# Patient Record
Sex: Male | Born: 1950
Health system: Southern US, Community
[De-identification: ages and names within clinical notes are randomized; demographics above are authoritative.]

## PROBLEM LIST (undated history)

## (undated) DIAGNOSIS — R0600 Dyspnea, unspecified: Secondary | ICD-10-CM

## (undated) DIAGNOSIS — M1990 Unspecified osteoarthritis, unspecified site: Secondary | ICD-10-CM

## (undated) DIAGNOSIS — M25512 Pain in left shoulder: Secondary | ICD-10-CM

## (undated) DIAGNOSIS — R42 Dizziness and giddiness: Secondary | ICD-10-CM

## (undated) DIAGNOSIS — I2699 Other pulmonary embolism without acute cor pulmonale: Secondary | ICD-10-CM

## (undated) DIAGNOSIS — R972 Elevated prostate specific antigen [PSA]: Secondary | ICD-10-CM

## (undated) DIAGNOSIS — K649 Unspecified hemorrhoids: Secondary | ICD-10-CM

## (undated) DIAGNOSIS — M549 Dorsalgia, unspecified: Secondary | ICD-10-CM

## (undated) DIAGNOSIS — G4700 Insomnia, unspecified: Secondary | ICD-10-CM

## (undated) DIAGNOSIS — H933X1 Disorders of right acoustic nerve: Secondary | ICD-10-CM

## (undated) DIAGNOSIS — E785 Hyperlipidemia, unspecified: Secondary | ICD-10-CM

## (undated) DIAGNOSIS — M25511 Pain in right shoulder: Secondary | ICD-10-CM

## (undated) DIAGNOSIS — K219 Gastro-esophageal reflux disease without esophagitis: Secondary | ICD-10-CM

## (undated) DIAGNOSIS — J310 Chronic rhinitis: Secondary | ICD-10-CM

## (undated) DIAGNOSIS — I4891 Unspecified atrial fibrillation: Secondary | ICD-10-CM

## (undated) DIAGNOSIS — I499 Cardiac arrhythmia, unspecified: Secondary | ICD-10-CM

## (undated) DIAGNOSIS — R351 Nocturia: Secondary | ICD-10-CM

## (undated) DIAGNOSIS — R3911 Hesitancy of micturition: Secondary | ICD-10-CM

## (undated) DIAGNOSIS — H9190 Unspecified hearing loss, unspecified ear: Secondary | ICD-10-CM

## (undated) DIAGNOSIS — N529 Male erectile dysfunction, unspecified: Secondary | ICD-10-CM

## (undated) HISTORY — PX: COLONOSCOPY: SHX174

## (undated) HISTORY — DX: Pain in right shoulder: M25.511

## (undated) HISTORY — DX: Hesitancy of micturition: R39.11

## (undated) HISTORY — PX: OTHER SURGICAL HISTORY: SHX169

## (undated) HISTORY — DX: Hyperlipidemia, unspecified: E78.5

## (undated) HISTORY — DX: Chronic rhinitis: J31.0

## (undated) HISTORY — DX: Nocturia: R35.1

## (undated) HISTORY — PX: TONSILLECTOMY AND ADENOIDECTOMY: SUR1326

## (undated) HISTORY — DX: Male erectile dysfunction, unspecified: N52.9

## (undated) HISTORY — DX: Insomnia, unspecified: G47.00

## (undated) HISTORY — DX: Unspecified osteoarthritis, unspecified site: M19.90

## (undated) HISTORY — DX: Pain in right shoulder: M25.512

## (undated) HISTORY — DX: Disorders of right acoustic nerve: H93.3X1

## (undated) HISTORY — DX: Unspecified atrial fibrillation: I48.91

## (undated) HISTORY — PX: RETINAL DETACHMENT SURGERY: SHX105

## (undated) HISTORY — DX: Elevated prostate specific antigen (PSA): R97.20

## (undated) HISTORY — DX: Gastro-esophageal reflux disease without esophagitis: K21.9

## (undated) HISTORY — DX: Dorsalgia, unspecified: M54.9

## (undated) HISTORY — DX: Unspecified hemorrhoids: K64.9

## (undated) HISTORY — DX: Unspecified hearing loss, unspecified ear: H91.90

## (undated) HISTORY — PX: CATARACT EXTRACTION: SUR2

## (undated) HISTORY — DX: Other pulmonary embolism without acute cor pulmonale: I26.99

## (undated) HISTORY — DX: Dizziness and giddiness: R42

## (undated) HISTORY — DX: Dyspnea, unspecified: R06.00

---

## 1987-05-23 HISTORY — PX: VASECTOMY: SHX75

## 2014-10-07 ENCOUNTER — Ambulatory Visit: Payer: Self-pay | Admitting: Cardiovascular Disease

## 2014-10-09 ENCOUNTER — Encounter: Payer: Self-pay | Admitting: Internal Medicine

## 2014-10-09 ENCOUNTER — Ambulatory Visit (INDEPENDENT_AMBULATORY_CARE_PROVIDER_SITE_OTHER): Payer: BLUE CROSS/BLUE SHIELD | Admitting: Internal Medicine

## 2014-10-09 VITALS — BP 126/74 | HR 75 | Ht 66.0 in | Wt 136.2 lb

## 2014-10-09 DIAGNOSIS — I480 Paroxysmal atrial fibrillation: Secondary | ICD-10-CM

## 2014-10-09 NOTE — Patient Instructions (Signed)
Medication Instructions:  Your physician recommends that you continue on your current medications as directed. Please refer to the Current Medication list given to you today.  Labwork: None ordered  Testing/Procedures: Your physician has recommended that you wear a 24 holter monitor. Holter monitors are medical devices that record the heart's electrical activity. Doctors most often use these monitors to diagnose arrhythmias. Arrhythmias are problems with the speed or rhythm of the heartbeat. The monitor is a small, portable device. You can wear one while you do your normal daily activities. This is usually used to diagnose what is causing palpitations/syncope (passing out).   Your physician has requested that you have an echocardiogram (before the 4 week follow up with Roderic Palau, NP). Echocardiography is a painless test that uses sound waves to create images of your heart. It provides your doctor with information about the size and shape of your heart and how well your heart's chambers and valves are working. This procedure takes approximately one hour. There are no restrictions for this procedure.   Your physician has recommended that you have a Cardioversion (DCCV) in 5 weeks.  Electrical Cardioversion uses a jolt of electricity to your heart either through paddles or wired patches attached to your chest. This is a controlled, usually prescheduled, procedure. Defibrillation is done under light anesthesia in the hospital, and you usually go home the day of the procedure. This is done to get your heart back into a normal rhythm. You are not awake for the procedure.  Miklos Bidinger, RN will call you to schedule this  Follow-Up: Your physician recommends that you schedule a follow-up appointment in: 4 weeks with Roderic Palau, NP   Thank you for choosing [facility]!!     Echocardiogram An echocardiogram, or echocardiography, uses sound waves (ultrasound) to produce an image of your  heart. The echocardiogram is simple, painless, obtained within a short period of time, and offers valuable information to your health care provider. The images from an echocardiogram can provide information such as:  Evidence of coronary artery disease (CAD).  Heart size.  Heart muscle function.  Heart valve function.  Aneurysm detection.  Evidence of a past heart attack.  Fluid buildup around the heart.  Heart muscle thickening.  Assess heart valve function. LET [facility] CARE PROVIDER KNOW ABOUT:  Any allergies you have.  All medicines you are taking, including vitamins, herbs, eye drops, creams, and over-the-counter medicines.  Previous problems you or members of your family have had with the use of anesthetics.  Any blood disorders you have.  Previous surgeries you have had.  Medical conditions you have.  Possibility of pregnancy, if this applies. BEFORE THE PROCEDURE  No special preparation is needed. Eat and drink normally.  PROCEDURE   In order to produce an image of your heart, gel will be applied to your chest and a wand-like tool (transducer) will be moved over your chest. The gel will help transmit the sound waves from the transducer. The sound waves will harmlessly bounce off your heart to allow the heart images to be captured in real-time motion. These images will then be recorded.  You may need an IV to receive a medicine that improves the quality of the pictures. AFTER THE PROCEDURE You may return to your normal schedule including diet, activities, and medicines, unless your health care provider tells you otherwise. Document Released: 05/05/2000 Document Revised: 09/22/2013 Document Reviewed: 01/13/2013 [facility] Patient Information 2015 [place], [place]. This information is not intended to replace advice  given to you by your health care provider. Make sure you discuss any questions you have with your health care provider.    Electrical  Cardioversion Electrical cardioversion is the delivery of a jolt of electricity to change the rhythm of the heart. Sticky patches or metal paddles are placed on the chest to deliver the electricity from a device. This is done to restore a normal rhythm. A rhythm that is too fast or not regular keeps the heart from pumping well. Electrical cardioversion is done in an emergency if:   There is low or no blood pressure as a result of the heart rhythm.   Normal rhythm must be restored as fast as possible to protect the brain and heart from further damage.   It may save a life. Cardioversion may be done for heart rhythms that are not immediately life threatening, such as atrial fibrillation or flutter, in which:   The heart is beating too fast or is not regular.   Medicine to change the rhythm has not worked.   It is safe to wait in order to allow time for preparation.  Symptoms of the abnormal rhythm are bothersome.  The risk of stroke and other serious problems can be reduced. LET [facility] CARE PROVIDER KNOW ABOUT:   Any allergies you have.  All medicines you are taking, including vitamins, herbs, eye drops, creams, and over-the-counter medicines.  Previous problems you or members of your family have had with the use of anesthetics.   Any blood disorders you have.   Previous surgeries you have had.   Medical conditions you have. RISKS AND COMPLICATIONS  Generally, this is a safe procedure. However, problems can occur and include:   Breathing problems related to the anesthetic used.  A blood clot that breaks free and travels to other parts of your body. This could cause a stroke or other problems. The risk of this is lowered by use of blood-thinning medicine (anticoagulant) prior to the procedure.  Cardiac arrest (rare). BEFORE THE PROCEDURE   You may have tests to detect blood clots in your heart and to evaluate heart function.  You may start taking anticoagulants  so your blood does not clot as easily.   Medicines may be given to help stabilize your heart rate and rhythm. PROCEDURE  You will be given medicine through an IV tube to reduce discomfort and make you sleepy (sedative).   An electrical shock will be delivered. AFTER THE PROCEDURE Your heart rhythm will be watched to make sure it does not change.  Document Released: 04/28/2002 Document Revised: 09/22/2013 Document Reviewed: 11/20/2012 [facility] Patient Information 2015 [place], [place]. This information is not intended to replace advice given to you by your health care provider. Make sure you discuss any questions you have with your health care provider.

## 2014-10-09 NOTE — Progress Notes (Signed)
ELECTROPHYSIOLOGY CONSULT NOTE  Patient ID: Robert Hart, MRN: [id number], DOB/AGE: 02-01-1951 64 y.o. Admit date: (Not on file) Date of Consult: 10/09/2014  Primary Physician: Donnajean Lopes, MD Primary Cardiologist: new  Chief Complaint: afib  HPI Robert Hart is a 64 y.o. male referred because of newly identified atrial fibrillation identified in the context of a complaint of dyspnea and fatigue as well as lethargy. This has been notable for the last couple of months. He has had no palpitations. ECG demonstrated atrial fibrillation and he was started on apixaban referred for further opinions. He has had no problems with edema   He has no known heart disease. He denies transient neurological episodes. His CHADS-VASc score 0 Outside records reviewed demonstrating normal chemistries normal hemogram HDL of 66 and LDL was 71. Lab 5/16  His wife is concerned about a cough that has been present for a number of months.  It is worse in the morning and abate gradually as the day goes on. There is no associated brackish taste. It has been nonproductive. Notably, however, the wife is noted a 9 pound weight loss over the last year. This was also noted by Robert Hart. This represents 6% of his body weight and has been unintentional.  Cognitive impairment noted by both the patient and his wife and is of major concern.      Past Medical History  Diagnosis Date  . Dyspnea   . Nocturia   . Urinary hesitancy   . Back pain   . Arthritis   . ED (erectile dysfunction)   . PE (pulmonary embolism)   . Hemorrhoids   . Insomnia   . Shoulder pain, bilateral   . Rhinitis   . GERD (gastroesophageal reflux disease)   . Hyperlipidemia       Surgical History:  Past Surgical History  Procedure Laterality Date  . Tonsillectomy and adenoidectomy    . Vasectomy  1989  . Colonoscopy       Home Meds: Prior to Admission medications   Medication Sig Start Date End Date Taking? Authorizing  Provider  amoxicillin-clavulanate (AUGMENTIN) 875-125 MG per tablet Take 1 tablet by mouth 2 (two) times daily.  08/01/14  Yes Historical Provider, MD  desmopressin (DDAVP) 0.2 MG tablet Take 0.2 mg by mouth daily.   Yes Historical Provider, MD  ELIQUIS 5 MG TABS tablet Take 5 mg by mouth 2 (two) times daily.  10/06/14  Yes Historical Provider, MD  neomycin-polymyxin b-dexamethasone (MAXITROL) 3.5-10000-0.1 SUSP Place 1 drop into both eyes.  08/01/14  Yes Historical Provider, MD  simvastatin (ZOCOR) 80 MG tablet Take 80 mg by mouth daily at 6 PM.  08/21/14  Yes Historical Provider, MD  tamsulosin (FLOMAX) 0.4 MG CAPS capsule Take 0.4 mg by mouth daily.  09/21/14  Yes Historical Provider, MD  traMADol (ULTRAM) 50 MG tablet Take 50 mg by mouth every 6 (six) hours as needed for moderate pain or severe pain.  09/21/14  Yes Historical Provider, MD  traZODone (DESYREL) 100 MG tablet Take 100 mg by mouth at bedtime.  09/21/14  Yes Historical Provider, MD  zaleplon (SONATA) 5 MG capsule Take 5 mg by mouth at bedtime as needed for sleep.   Yes Historical Provider, MD      Allergies: No Known Allergies  History   Social History  . Marital Status: Married    Spouse Name: N/A  . Number of Children: N/A  . Years of Education: N/A   Occupational History  .  Not on file.   Social History Main Topics  . Smoking status: Never Smoker   . Smokeless tobacco: Not on file  . Alcohol Use: Not on file  . Drug Use: Not on file  . Sexual Activity: Not on file   Other Topics Concern  . Not on file   Social History Narrative     Family History  Problem Relation Age of Onset  . Breast cancer Mother   . Prostate cancer Father   . COPD Father   . Lung disease Father      ROS:  Please see the history of present illness.   Negative except Nocturia  All other systems reviewed and negative.    Physical Exam:   Blood pressure 126/74, pulse 75, height 5\' 6"  (1.676 m), weight 136 lb 3.2 oz (61.78 kg). General:  Well developed, well nourished male in no acute distress. Head: Normocephalic, atraumatic, sclera non-icteric, no xanthomas, nares are without discharge. EENT: normal Lymph Nodes:  none Back: without scoliosis/kyphosis , no CVA tendersness Neck: Negative for carotid bruits. JVD not elevated. Lungs: Clear bilaterally to auscultation without wheezes, rales, or rhonchi. Breathing is unlabored. Heart:  Irregularly irregular rate with a 2/6 systolic early  murmur , Abdomen: Soft, non-tender, non-distended with normoactive bowel sounds. No hepatomegaly. No rebound/guarding. No obvious abdominal masses. Msk:  Strength and tone appear normal for age. Extremities: No clubbing or cyanosis. N * edema.  Distal pedal pulses are 2+ and equal bilaterally. Skin: Warm and Dry Neuro: Alert and oriented X 3. CN III-XII intact Grossly normal sensory and motor function . Psych:  Responds to questions appropriately with a normal affect.      Labs:   Radiology/Studies:  No results found.  EKG:  Atrial fibrillation and 75 minutes intervals-/09/39 Axis 95   Assessment and Plan:  Atrial fibrillation  Cough-a.m.  Weight loss unintentional-6%  Cognitive impairment  The patient has atrial fibrillation with functional intolerance manifested by dyspnea on exertion fatigue and lethargy. Heart rates are controlled at rest. We will plan to get a 24-hour Holter to look at heart rate excursion with effort to see if augmented rate control would be of value. We'll also get an echocardiogram to look at LV and LA structure. This will inform prognosis as well as treatment options. We would anticipate cardioversion after 3-4 weeks of anticoagulation which is inappropriately initiated by Dr. Almon Hart with apixaban  I don't think the atrial fibrillation can explain weight loss. Neuro I think is likely explanation for the cough unless it is been much more persistent than we think and is responsible for some nocturnal volume  overload which is then resolves with vertical. Given these 2 concerns however, I spoke with his PCP and we will undertake imaging of his chest. I also don't think that the atrial fibrillation is likely responsible for cognitive impairment especially that has been notable for a couple of years. However, the atrial fibrillation has been long-standing persistent perhaps and so we will have that reevaluated after sinus rhythm is restored. I've discussed with them the possibility of owing to the [facility] memory evaluation center; his PCP we'll undertake that referral.       Virl Axe

## 2014-10-12 DIAGNOSIS — I480 Paroxysmal atrial fibrillation: Secondary | ICD-10-CM | POA: Insufficient documentation

## 2014-10-13 ENCOUNTER — Other Ambulatory Visit: Payer: Self-pay

## 2014-10-13 ENCOUNTER — Ambulatory Visit (HOSPITAL_COMMUNITY): Payer: BLUE CROSS/BLUE SHIELD | Attending: Cardiovascular Disease

## 2014-10-13 ENCOUNTER — Ambulatory Visit (INDEPENDENT_AMBULATORY_CARE_PROVIDER_SITE_OTHER): Payer: BLUE CROSS/BLUE SHIELD

## 2014-10-13 DIAGNOSIS — I480 Paroxysmal atrial fibrillation: Secondary | ICD-10-CM | POA: Insufficient documentation

## 2014-10-20 ENCOUNTER — Telehealth: Payer: Self-pay | Admitting: Internal Medicine

## 2014-10-20 NOTE — Telephone Encounter (Signed)
New Message  Pt calling to speak w/ Rn about test results. Please call back and discuss.

## 2014-10-20 NOTE — Telephone Encounter (Signed)
Reviewed echo results with patient. Advised that monitor results are not available yet, but I will look into this tomorrow. Informed we will call him once reviewed by Dr. Caryl Comes. Patient verbalized understanding and agreeable to plan.

## 2014-11-06 ENCOUNTER — Other Ambulatory Visit (HOSPITAL_COMMUNITY): Payer: Self-pay | Admitting: *Deleted

## 2014-11-06 ENCOUNTER — Encounter (HOSPITAL_COMMUNITY): Payer: Self-pay | Admitting: Nurse Practitioner

## 2014-11-06 ENCOUNTER — Ambulatory Visit (HOSPITAL_COMMUNITY)
Admission: RE | Admit: 2014-11-06 | Discharge: 2014-11-06 | Disposition: A | Discharge status: Home / Self Care | Payer: BLUE CROSS/BLUE SHIELD | Source: Ambulatory Visit | Attending: Nurse Practitioner | Admitting: Nurse Practitioner

## 2014-11-06 ENCOUNTER — Telehealth (HOSPITAL_COMMUNITY): Payer: Self-pay | Admitting: *Deleted

## 2014-11-06 VITALS — BP 112/74 | HR 88 | Ht 66.0 in | Wt 138.8 lb

## 2014-11-06 DIAGNOSIS — I480 Paroxysmal atrial fibrillation: Secondary | ICD-10-CM

## 2014-11-06 DIAGNOSIS — I4891 Unspecified atrial fibrillation: Secondary | ICD-10-CM | POA: Insufficient documentation

## 2014-11-06 LAB — BASIC METABOLIC PANEL
ANION GAP: 8 (ref 5–15)
BUN: 18 mg/dL (ref 6–20)
CALCIUM: 9.7 mg/dL (ref 8.9–10.3)
CO2: 28 mmol/L (ref 22–32)
Chloride: 104 mmol/L (ref 101–111)
Creatinine, Ser: 1.18 mg/dL (ref 0.61–1.24)
GFR calc Af Amer: >60 mL/min (ref >60)
GLUCOSE: 101 mg/dL — AB (ref 65–99)
Potassium: 4.2 mmol/L (ref 3.5–5.1)
SODIUM: 140 mmol/L (ref 135–145)

## 2014-11-06 LAB — CBC
HEMATOCRIT: 42 % (ref 39.0–52.0)
Hemoglobin: 14.1 g/dL (ref 13.0–17.0)
MCH: 30.4 pg (ref 26.0–34.0)
MCHC: 33.6 g/dL (ref 30.0–36.0)
MCV: 90.5 fL (ref 78.0–100.0)
PLATELETS: 175 10*3/uL (ref 150–400)
RBC: 4.64 MIL/uL (ref 4.22–5.81)
RDW: 12.7 % (ref 11.5–15.5)
WBC: 7.3 10*3/uL (ref 4.0–10.5)

## 2014-11-06 MED ORDER — ELIQUIS 5 MG PO TABS: 5.0000 mg | ORAL_TABLET | Freq: Two times a day (BID) | ORAL | Status: DC

## 2014-11-06 NOTE — Patient Instructions (Signed)
Cardioversion is scheduled for Monday, June 27th .  Go to the [facility] @ 12pm and go to admitting.  - Do not eat or drink anything after midnight the night prior to your procedure.  - Take your medications with a sip of water prior to arrival.  - You will not be able to drive home after your procedure.  Appt with Roderic Palau, NP 1 week post cardioversion. Parking garage code is [id number].

## 2014-11-06 NOTE — Telephone Encounter (Signed)
Pt called back this afternoon stating that his blood pressure seems to be more elevated than it was this morning in clinic it was up to 130/100 HR 106. Patient states he just does not feel as well; keeps waking up from naps feeling like he cannot catch his breath.  Patient kept asking if there was someone here to perform a cardioversion today -- instructed patient if he felt he needed to be evaluated for cardioversion today he would have to go to the emergency room. Encouraged patient to start Cardizem 120mg  that was prescribed starting in the morning and give that a few days to adjust his BP/HR but if he felt symptoms worsening or increased shortness of breath to report to the emergency room. Patient verbalized understanding of when to report to emergency room.

## 2014-11-06 NOTE — Progress Notes (Signed)
Patient ID: Robert Hart, male   DOB: 09/04/1950, 64 y.o.   MRN: [id number]     Primary Care Physician: Donnajean Lopes, MD Referring Physician:   [name] is a 63 y.o. male with a h/o ne onset afib initially evaluated by Dr. Caryl Comes 5/20. Pt is mildly symptomatic with fatigue and dyspnea. He has a chadsvasc score of 0 and has been on apixaban since 5/16. He wore an monitor  Which did no show afib at the time wearing x 24 hours but Ekg does confirm afib today with v rate itially of 130's, rechecked by me after resting in the 80's. He can usually do his usual work without having to stop and rest due to the afib. He had a sleep study years ago and was found not to have sleep apnea but does snore, no apnea reported by wife. No significant alcohol use, no tobacco use. Exercises on a regular basis and is not overweight. In fact he has had some unintentional weight loss that is being evaluated by PCP. Has not been on rate control, had antiarrythmic's  or prior DCCV in the past. EKg today shows afib with rvr but pt states this is not usually the case, usually less than 100. When I rechecked heart rate, it was in the 80's.  Today, he denies symptoms of palpitations, chest pain, shortness of breath, orthopnea, PND, lower extremity edema, dizziness, presyncope, syncope, or neurologic sequela. The patient is tolerating medications without difficulties and is otherwise without complaint today.   Past Medical History  Diagnosis Date  . Dyspnea   . Nocturia   . Urinary hesitancy   . Back pain   . Arthritis   . ED (erectile dysfunction)   . PE (pulmonary embolism)   . Hemorrhoids   . Insomnia   . Shoulder pain, bilateral   . Rhinitis   . GERD (gastroesophageal reflux disease)   . Hyperlipidemia    Past Surgical History  Procedure Laterality Date  . Tonsillectomy and adenoidectomy    . Vasectomy  1989  . Colonoscopy      Current Outpatient Prescriptions  Medication Sig Dispense Refill  .  ELIQUIS 5 MG TABS tablet Take 5 mg by mouth 2 (two) times daily.   11  . simvastatin (ZOCOR) 80 MG tablet Take 80 mg by mouth daily at 6 PM.   11  . tamsulosin (FLOMAX) 0.4 MG CAPS capsule Take 0.4 mg by mouth daily.   11  . traMADol (ULTRAM) 50 MG tablet Take 50 mg by mouth every 6 (six) hours as needed for moderate pain or severe pain.   4  . traZODone (DESYREL) 100 MG tablet Take 100 mg by mouth at bedtime.   11  . zaleplon (SONATA) 5 MG capsule Take 5 mg by mouth at bedtime as needed for sleep.     No current facility-administered medications for this encounter.    No Known Allergies  History   Social History  . Marital Status: Married    Spouse Name: N/A  . Number of Children: N/A  . Years of Education: N/A   Occupational History  . Not on file.   Social History Main Topics  . Smoking status: Never Smoker   . Smokeless tobacco: Not on file  . Alcohol Use: Not on file  . Drug Use: Not on file  . Sexual Activity: Not on file   Other Topics Concern  . Not on file   Social History Narrative  Family History  Problem Relation Age of Onset  . Breast cancer Mother   . Prostate cancer Father   . COPD Father   . Lung disease Father     ROS- All systems are reviewed and negative except as per the HPI above  Physical Exam: Filed Vitals:   11/06/14 0846 11/06/14 0916  BP: 112/74   Pulse: 134 88  Height: 5\' 6"  (1.676 m)   Weight: 138 lb 12.8 oz (62.959 kg)     GEN- The patient is well appearing, alert and oriented x 3 today.   Head- normocephalic, atraumatic Eyes-  Sclera clear, conjunctiva pink Ears- hearing intact Oropharynx- clear Neck- supple, no JVP Lymph- no cervical lymphadenopathy Lungs- Clear to ausculation bilaterally, normal work of breathing Heart- Regular rate and rhythm, no murmurs, rubs or gallops, PMI not laterally displaced GI- soft, NT, ND, + BS Extremities- no clubbing, cyanosis, or edema MS- no significant deformity or atrophy Skin- no  rash or lesion Psych- euthymic mood, full affect Neuro- strength and sensation are intact  EKG- afib with rvr 134 bpm, rightward axis,  marked ST abnormality  Echo-Left ventricle: The cavity size was normal. Systolic function was normal. The estimated ejection fraction was in the range of 55% to 60%. Wall motion was normal; there were no regional wall motion abnormalities. - Atrial septum: No defect or patent foramen ovale was identified.  Holter5/24/16 Comment: Preliminary Technician Interpretation: Sinus bradycardia to sinus tachycardia No diary events entered.(no Md interpretation available yet)  Assessment and Plan:  1. New onset afib  Per Dr, Olin Pia plan, will set up for DCCV. He has been on uninterrupted DOAC since 5/16 . Reminded not to miss any doses between now and DCCV He prefers to have DCCV scheduled the week of 6/27. Pre procedure labs today  F/u here past cardioversion and then schedule f/u later with Dr. Caryl Comes

## 2014-11-09 NOTE — Progress Notes (Signed)
Expand All Collapse All   Pt called back this afternoon stating that his blood pressure seems to be more elevated than it was this morning in clinic it was up to 130/100 HR 106. Patient states he just does not feel as well; keeps waking up from naps feeling like he cannot catch his breath. Patient kept asking if there was someone here to perform a cardioversion today -- instructed patient if he felt he needed to be evaluated for cardioversion today he would have to go to the emergency room. Encouraged patient to start Cardizem 120mg  that was prescribed starting in the morning and give that a few days to adjust his BP/HR but if he felt symptoms worsening or increased shortness of breath to report to the emergency room. Patient verbalized understanding of when to report to emergency room.      Mention of starting of cardizem was entered in error. Patient is not on cardizem.

## 2014-11-16 ENCOUNTER — Encounter (HOSPITAL_COMMUNITY)
Admission: RE | Disposition: A | Discharge status: Home / Self Care | Payer: Self-pay | Source: Ambulatory Visit | Attending: Cardiovascular Disease

## 2014-11-16 ENCOUNTER — Ambulatory Visit (HOSPITAL_COMMUNITY): Payer: BLUE CROSS/BLUE SHIELD | Admitting: Certified Registered Nurse Anesthetist

## 2014-11-16 ENCOUNTER — Encounter (HOSPITAL_COMMUNITY): Payer: Self-pay | Admitting: *Deleted

## 2014-11-16 ENCOUNTER — Ambulatory Visit (HOSPITAL_COMMUNITY)
Admission: RE | Admit: 2014-11-16 | Discharge: 2014-11-16 | Disposition: A | Discharge status: Home / Self Care | Payer: BLUE CROSS/BLUE SHIELD | Source: Ambulatory Visit | Attending: Cardiovascular Disease | Admitting: Cardiovascular Disease

## 2014-11-16 DIAGNOSIS — I4891 Unspecified atrial fibrillation: Secondary | ICD-10-CM | POA: Diagnosis present

## 2014-11-16 DIAGNOSIS — Z7901 Long term (current) use of anticoagulants: Secondary | ICD-10-CM | POA: Insufficient documentation

## 2014-11-16 DIAGNOSIS — E785 Hyperlipidemia, unspecified: Secondary | ICD-10-CM | POA: Insufficient documentation

## 2014-11-16 DIAGNOSIS — I481 Persistent atrial fibrillation: Secondary | ICD-10-CM

## 2014-11-16 DIAGNOSIS — I509 Heart failure, unspecified: Secondary | ICD-10-CM | POA: Diagnosis not present

## 2014-11-16 DIAGNOSIS — K219 Gastro-esophageal reflux disease without esophagitis: Secondary | ICD-10-CM | POA: Insufficient documentation

## 2014-11-16 DIAGNOSIS — I252 Old myocardial infarction: Secondary | ICD-10-CM | POA: Insufficient documentation

## 2014-11-16 DIAGNOSIS — Z79891 Long term (current) use of opiate analgesic: Secondary | ICD-10-CM | POA: Diagnosis not present

## 2014-11-16 DIAGNOSIS — I4811 Longstanding persistent atrial fibrillation: Secondary | ICD-10-CM | POA: Insufficient documentation

## 2014-11-16 DIAGNOSIS — M1990 Unspecified osteoarthritis, unspecified site: Secondary | ICD-10-CM | POA: Diagnosis not present

## 2014-11-16 DIAGNOSIS — Z79899 Other long term (current) drug therapy: Secondary | ICD-10-CM | POA: Diagnosis not present

## 2014-11-16 DIAGNOSIS — Z86711 Personal history of pulmonary embolism: Secondary | ICD-10-CM | POA: Insufficient documentation

## 2014-11-16 DIAGNOSIS — G4700 Insomnia, unspecified: Secondary | ICD-10-CM | POA: Diagnosis not present

## 2014-11-16 HISTORY — PX: CARDIOVERSION: SHX1299

## 2014-11-16 SURGERY — CARDIOVERSION
Anesthesia: General

## 2014-11-16 MED ORDER — SODIUM CHLORIDE 0.9 % IV SOLN
INTRAVENOUS | Status: DC
  Administered 2014-11-16: 500 mL via INTRAVENOUS

## 2014-11-16 MED ORDER — PROPOFOL 10 MG/ML IV BOLUS
INTRAVENOUS | Status: DC | PRN
  Administered 2014-11-16 (×2): 60 mg via INTRAVENOUS

## 2014-11-16 NOTE — Anesthesia Postprocedure Evaluation (Signed)
  Anesthesia Post-op Note  Patient: Robert Hart  Procedure(s) Performed: Procedure(s): CARDIOVERSION (N/A)  Patient Location: Endoscopy Unit  Anesthesia Type:General  Level of Consciousness: awake  Airway and Oxygen Therapy: Patient Spontanous Breathing  Post-op Pain: none  Post-op Assessment: Post-op Vital signs reviewed, Patient's Cardiovascular Status Stable, Respiratory Function Stable, Patent Airway, No signs of Nausea or vomiting and Pain level controlled              Post-op Vital Signs: Reviewed and stable  Last Vitals:  Filed Vitals:   11/16/14 1320  BP: 116/81  Pulse: 80  Temp: 36.8 C  Resp: 15    Complications: No apparent anesthesia complications

## 2014-11-16 NOTE — Op Note (Signed)
Procedure: Electrical Cardioversion Indications:  Atrial Fibrillation  Procedure Details:  Consent: Risks of procedure as well as the alternatives and risks of each were explained to the (patient/caregiver).  Consent for procedure obtained.  Time Out: Verified patient identification, verified procedure, site/side was marked, verified correct patient position, special equipment/implants available, medications/allergies/relevent history reviewed, required imaging and test results available.  Performed  Patient placed on cardiac monitor, pulse oximetry, supplemental oxygen as necessary.  Sedation given: Propofol 120 mg IV, Dr. Ermalene Postin Pacer pads placed anterior and posterior chest.  Cardioverted 3 time(s).  Cardioversion with synchronized biphasic 120J, 150J, 200J shocks. Each time there was very brief return to NSR followed by rapid tachycardia with distinct, spiky, high frequency P waves (pulmonary vein tachycardia?), subsequently deteriorating to typical atrial fibrillation. Sinus rhythm was maintained for a maximum of 5 seconds.  Evaluation: Findings: Post procedure EKG shows: Atrial Fibrillation Complications: None Patient did tolerate procedure well.  He has f/u in AFib clinic June 6. Will ask Dr. Caryl Comes if there are other interventions planned in the interim. Continue anticoagulant.  Time Spent Directly with the Patient:  30 minutes   Robert Hart 11/16/2014, 1:36 PM

## 2014-11-16 NOTE — Anesthesia Postprocedure Evaluation (Signed)
  Anesthesia Post-op Note  Patient: Robert Hart  Procedure(s) Performed: Procedure(s): CARDIOVERSION (N/A)  Patient Location: Endoscopy Unit  Anesthesia Type:General  Level of Consciousness: awake, alert  and oriented  Airway and Oxygen Therapy: Patient Spontanous Breathing  Post-op Pain: none  Post-op Assessment: Post-op Vital signs reviewed, Patient's Cardiovascular Status Stable, Respiratory Function Stable, Patent Airway and No signs of Nausea or vomiting              Post-op Vital Signs: Reviewed and stable  Last Vitals:  Filed Vitals:   11/16/14 1320  BP: 116/81  Pulse: 80  Temp:   Resp: 15    Complications: No apparent anesthesia complications

## 2014-11-16 NOTE — Discharge Instructions (Signed)

## 2014-11-16 NOTE — Interval H&P Note (Signed)
History and Physical Interval Note:  11/16/2014 2:41 PM  [name]  has presented today for surgery, with the diagnosis of A FIB   The various methods of treatment have been discussed with the patient and family. After consideration of risks, benefits and other options for treatment, the patient has consented to  Procedure(s): CARDIOVERSION (N/A) as a surgical intervention .  The patient's history has been reviewed, patient examined, no change in status, stable for surgery.  I have reviewed the patient's chart and labs.  Questions were answered to the patient's satisfaction.     Robert Hart

## 2014-11-16 NOTE — H&P (View-Only) (Signed)
Patient ID: Robert Hart, male   DOB: Oct 29, 1950, 64 y.o.   MRN: [id number]     Primary Care Physician: Donnajean Lopes, MD Referring Physician:   DAEQUAN KOZMA is a 64 y.o. male with a h/o ne onset afib initially evaluated by Dr. Caryl Comes 5/20. Pt is mildly symptomatic with fatigue and dyspnea. He has a chadsvasc score of 0 and has been on apixaban since 5/16. He wore an monitor  Which did no show afib at the time wearing x 24 hours but Ekg does confirm afib today with v rate itially of 130's, rechecked by me after resting in the 80's. He can usually do his usual work without having to stop and rest due to the afib. He had a sleep study years ago and was found not to have sleep apnea but does snore, no apnea reported by wife. No significant alcohol use, no tobacco use. Exercises on a regular basis and is not overweight. In fact he has had some unintentional weight loss that is being evaluated by PCP. Has not been on rate control, had antiarrythmic's  or prior DCCV in the past. EKg today shows afib with rvr but pt states this is not usually the case, usually less than 100. When I rechecked heart rate, it was in the 80's.  Today, he denies symptoms of palpitations, chest pain, shortness of breath, orthopnea, PND, lower extremity edema, dizziness, presyncope, syncope, or neurologic sequela. The patient is tolerating medications without difficulties and is otherwise without complaint today.   Past Medical History  Diagnosis Date  . Dyspnea   . Nocturia   . Urinary hesitancy   . Back pain   . Arthritis   . ED (erectile dysfunction)   . PE (pulmonary embolism)   . Hemorrhoids   . Insomnia   . Shoulder pain, bilateral   . Rhinitis   . GERD (gastroesophageal reflux disease)   . Hyperlipidemia    Past Surgical History  Procedure Laterality Date  . Tonsillectomy and adenoidectomy    . Vasectomy  1989  . Colonoscopy      Current Outpatient Prescriptions  Medication Sig Dispense Refill  .  ELIQUIS 5 MG TABS tablet Take 5 mg by mouth 2 (two) times daily.   11  . simvastatin (ZOCOR) 80 MG tablet Take 80 mg by mouth daily at 6 PM.   11  . tamsulosin (FLOMAX) 0.4 MG CAPS capsule Take 0.4 mg by mouth daily.   11  . traMADol (ULTRAM) 50 MG tablet Take 50 mg by mouth every 6 (six) hours as needed for moderate pain or severe pain.   4  . traZODone (DESYREL) 100 MG tablet Take 100 mg by mouth at bedtime.   11  . zaleplon (SONATA) 5 MG capsule Take 5 mg by mouth at bedtime as needed for sleep.     No current facility-administered medications for this encounter.    No Known Allergies  History   Social History  . Marital Status: Married    Spouse Name: N/A  . Number of Children: N/A  . Years of Education: N/A   Occupational History  . Not on file.   Social History Main Topics  . Smoking status: Never Smoker   . Smokeless tobacco: Not on file  . Alcohol Use: Not on file  . Drug Use: Not on file  . Sexual Activity: Not on file   Other Topics Concern  . Not on file   Social History Narrative  Family History  Problem Relation Age of Onset  . Breast cancer Mother   . Prostate cancer Father   . COPD Father   . Lung disease Father     ROS- All systems are reviewed and negative except as per the HPI above  Physical Exam: Filed Vitals:   11/06/14 0846 11/06/14 0916  BP: 112/74   Pulse: 134 88  Height: 5\' 6"  (1.676 m)   Weight: 138 lb 12.8 oz (62.959 kg)     GEN- The patient is well appearing, alert and oriented x 3 today.   Head- normocephalic, atraumatic Eyes-  Sclera clear, conjunctiva pink Ears- hearing intact Oropharynx- clear Neck- supple, no JVP Lymph- no cervical lymphadenopathy Lungs- Clear to ausculation bilaterally, normal work of breathing Heart- Regular rate and rhythm, no murmurs, rubs or gallops, PMI not laterally displaced GI- soft, NT, ND, + BS Extremities- no clubbing, cyanosis, or edema MS- no significant deformity or atrophy Skin- no  rash or lesion Psych- euthymic mood, full affect Neuro- strength and sensation are intact  EKG- afib with rvr 134 bpm, rightward axis,  marked ST abnormality  Echo-Left ventricle: The cavity size was normal. Systolic function was normal. The estimated ejection fraction was in the range of 55% to 60%. Wall motion was normal; there were no regional wall motion abnormalities. - Atrial septum: No defect or patent foramen ovale was identified.  Holter5/24/16 Comment: Preliminary Technician Interpretation: Sinus bradycardia to sinus tachycardia No diary events entered.(no Md interpretation available yet)  Assessment and Plan:  1. New onset afib  Per Dr, Olin Pia plan, will set up for DCCV. He has been on uninterrupted DOAC since 5/16 . Reminded not to miss any doses between now and DCCV He prefers to have DCCV scheduled the week of 6/27. Pre procedure labs today  F/u here past cardioversion and then schedule f/u later with Dr. Caryl Comes

## 2014-11-16 NOTE — Anesthesia Preprocedure Evaluation (Addendum)
Anesthesia Evaluation  Patient identified by MRN, date of birth, ID band Patient awake    Reviewed: Allergy & Precautions, NPO status , Patient's Chart, lab work & pertinent test results  History of Anesthesia Complications Negative for: history of anesthetic complications  Airway Mallampati: II  TM Distance: >3 FB Neck ROM: Full    Dental  (+) Teeth Intact   Pulmonary neg pulmonary ROS,  breath sounds clear to auscultation        Cardiovascular - Past MI and - CHF + dysrhythmias Atrial Fibrillation Rhythm:Irregular     Neuro/Psych negative neurological ROS  negative psych ROS   GI/Hepatic Neg liver ROS, GERD-  Medicated and Controlled,  Endo/Other  negative endocrine ROS  Renal/GU negative Renal ROS     Musculoskeletal   Abdominal   Peds  Hematology negative hematology ROS (+)   Anesthesia Other Findings   Reproductive/Obstetrics                            Anesthesia Physical Anesthesia Plan  ASA: II  Anesthesia Plan: General   Post-op Pain Management:    Induction: Intravenous  Airway Management Planned: Mask  Additional Equipment: None  Intra-op Plan:   Post-operative Plan:   Informed Consent: I have reviewed the patients History and Physical, chart, labs and discussed the procedure including the risks, benefits and alternatives for the proposed anesthesia with the patient or authorized representative who has indicated his/her understanding and acceptance.   Dental advisory given  Plan Discussed with: CRNA and Surgeon  Anesthesia Plan Comments:         Anesthesia Quick Evaluation

## 2014-11-16 NOTE — Transfer of Care (Signed)
Immediate Anesthesia Transfer of Care Note  Patient: Robert Hart  Procedure(s) Performed: Procedure(s): CARDIOVERSION (N/A)  Patient Location: Endoscopy Unit  Anesthesia Type:General  Level of Consciousness: awake, alert  and oriented  Airway & Oxygen Therapy: Patient Spontanous Breathing  Post-op Assessment: Report given to RN and Post -op Vital signs reviewed and stable  Post vital signs: Reviewed and stable  Last Vitals:  Filed Vitals:   11/16/14 1320  BP: 116/81  Pulse: 80  Temp:   Resp: 15    Complications: No apparent anesthesia complications

## 2014-11-17 ENCOUNTER — Encounter (HOSPITAL_COMMUNITY): Payer: Self-pay | Admitting: Cardiovascular Disease

## 2014-11-18 ENCOUNTER — Telehealth: Payer: Self-pay | Admitting: Internal Medicine

## 2014-11-18 NOTE — Telephone Encounter (Signed)
Patient returns my call.   Informed patient of current plan - explained that Dr. Caryl Hart spoke with Dr. Sallyanne Hart after failed DCCV.  Plan is to follow up with Robert Palau, NP in [facility]ib Clinic next week, start Flecainide and reschedule DCCV.  He is aware I will send Robert Hart a message that Dr. Caryl Hart would like to plan DCCV on a day that he is in the hospital (Dr. Caryl Hart not performing cardioversion, just in hospital on same day).  He is agreeable to this plan and appreciative of information.

## 2014-11-18 NOTE — Telephone Encounter (Signed)
New Message  Pt wanted to speak w/ RN about what to do going forward post-cardioversion. Pt wanted to discuss AF clinic. Please call back and discuss.

## 2014-11-25 ENCOUNTER — Ambulatory Visit (HOSPITAL_COMMUNITY)
Admission: RE | Admit: 2014-11-25 | Discharge: 2014-11-25 | Disposition: A | Discharge status: Home / Self Care | Payer: BLUE CROSS/BLUE SHIELD | Source: Ambulatory Visit | Attending: Nurse Practitioner | Admitting: Nurse Practitioner

## 2014-11-25 ENCOUNTER — Encounter (HOSPITAL_COMMUNITY): Payer: Self-pay | Admitting: Nurse Practitioner

## 2014-11-25 VITALS — BP 104/62 | HR 110 | Ht 68.0 in | Wt 137.6 lb

## 2014-11-25 DIAGNOSIS — I481 Persistent atrial fibrillation: Secondary | ICD-10-CM

## 2014-11-25 DIAGNOSIS — I4891 Unspecified atrial fibrillation: Secondary | ICD-10-CM | POA: Insufficient documentation

## 2014-11-25 DIAGNOSIS — I4819 Other persistent atrial fibrillation: Secondary | ICD-10-CM

## 2014-11-25 MED ORDER — METOPROLOL SUCCINATE ER 25 MG PO TB24: 12.5000 mg | ORAL_TABLET | Freq: Every day | ORAL | Status: DC

## 2014-11-25 MED ORDER — FLECAINIDE ACETATE 50 MG PO TABS: 50.0000 mg | ORAL_TABLET | Freq: Two times a day (BID) | ORAL | Status: DC

## 2014-11-25 NOTE — Patient Instructions (Signed)
Your physician has recommended you make the following change in your medication:  1)Flecainide 50mg  twice a day 2)Metoprolol (toprol) 12.5mg  (youll take a 1/2 tablet of 25mg ) once a day

## 2014-11-25 NOTE — Progress Notes (Signed)
Patient ID: Robert Hart, male   DOB: 04/28/1951, 65 y.o.   MRN: [id number]     Primary Care Physician: Donnajean Lopes, MD Referring Physician:Dr. Jaquavian Firkus is a 64 y.o. male with a h/o new onset afib initially evaluated by Dr. Caryl Comes 5/20. Pt is  symptomatic with fatigue and dyspnea. He has a chadsvasc score of 0 and has been on apixaban since 5/16. He wore an monitor  which did not show afib at the time wearing x 24 hours. However EKG does show that pt continues in afib. He can usually do his usual work without having to stop and rest due to the afib. He had a sleep study years ago and was found not to have sleep apnea but does snore, no apnea reported by wife. No significant alcohol use, no tobacco use. Exercises on a regular basis and is not overweight. In fact he has had some unintentional weight loss that is being evaluated by PCP. Has not been on rate control, had antiarrythmic's  or prior DCCV in the past. He was scheduled for cardioversion 6/27  but unfortunately did not cardiovert.   Therefore, I am seeing back today to start on flecainide per Dr. Olin Pia request and then will re scheduled for  cardioversion.  Today, he denies symptoms of palpitations, chest pain, shortness of breath, orthopnea, PND, lower extremity edema, dizziness, presyncope, syncope, or neurologic sequela. Positive for fatigue associated with afib. The patient is tolerating medications without difficulties and is otherwise without complaint today.   Past Medical History  Diagnosis Date  . Dyspnea   . Nocturia   . Urinary hesitancy   . Back pain   . Arthritis   . ED (erectile dysfunction)   . PE (pulmonary embolism)   . Hemorrhoids   . Insomnia   . Shoulder pain, bilateral   . Rhinitis   . GERD (gastroesophageal reflux disease)   . Hyperlipidemia    Past Surgical History  Procedure Laterality Date  . Tonsillectomy and adenoidectomy    . Vasectomy  1989  . Colonoscopy    . Cardioversion  N/A 11/16/2014    Procedure: CARDIOVERSION;  Surgeon: Sanda Klein, MD;  Location: [place];  Service: Cardiovascular;  Laterality: N/A;    Current Outpatient Prescriptions  Medication Sig Dispense Refill  . ELIQUIS 5 MG TABS tablet Take 1 tablet (5 mg total) by mouth 2 (two) times daily. 60 tablet 1  . esomeprazole (NEXIUM) 20 MG capsule Take 20 mg by mouth daily at 12 noon.    . Multiple Vitamins-Minerals (MULTIVITAMIN & MINERAL PO) Take 1 tablet by mouth daily.    . simvastatin (ZOCOR) 80 MG tablet Take 40 mg by mouth daily at 6 PM.   11  . tamsulosin (FLOMAX) 0.4 MG CAPS capsule Take 0.4 mg by mouth daily.   11  . traMADol (ULTRAM) 50 MG tablet Take 50 mg by mouth every 6 (six) hours as needed for moderate pain or severe pain.   4  . traZODone (DESYREL) 100 MG tablet Take 100 mg by mouth at bedtime.   11  . zaleplon (SONATA) 5 MG capsule Take 5 mg by mouth at bedtime as needed for sleep.    . flecainide (TAMBOCOR) 50 MG tablet Take 1 tablet (50 mg total) by mouth 2 (two) times daily. 60 tablet 1  . metoprolol succinate (TOPROL-XL) 25 MG 24 hr tablet Take 0.5 tablets (12.5 mg total) by mouth daily. 30 tablet 1   No  current facility-administered medications for this encounter.    No Known Allergies  History   Social History  . Marital Status: Married    Spouse Name: N/A  . Number of Children: N/A  . Years of Education: N/A   Occupational History  . Not on file.   Social History Main Topics  . Smoking status: Never Smoker   . Smokeless tobacco: Not on file  . Alcohol Use: Not on file  . Drug Use: Not on file  . Sexual Activity: Not on file   Other Topics Concern  . Not on file   Social History Narrative    Family History  Problem Relation Age of Onset  . Breast cancer Mother   . Prostate cancer Father   . COPD Father   . Lung disease Father     ROS- All systems are reviewed and negative except as per the HPI above  Physical Exam: Filed Vitals:    11/25/14 0849  BP: 104/62  Pulse: 110  Height: 5\' 8"  (1.727 m)  Weight: 137 lb 9.6 oz (62.415 kg)    GEN- The patient is well appearing, alert and oriented x 3 today.   Head- normocephalic, atraumatic Eyes-  Sclera clear, conjunctiva pink Ears- hearing intact Oropharynx- clear Neck- supple, no JVP Lymph- no cervical lymphadenopathy Lungs- Clear to ausculation bilaterally, normal work of breathing Heart- irregular rate and rhythm, no murmurs, rubs or gallops, PMI not laterally displaced GI- soft, NT, ND, + BS Extremities- no clubbing, cyanosis, or edema MS- no significant deformity or atrophy Skin- no rash or lesion Psych- euthymic mood, full affect Neuro- strength and sensation are intact  EKG- afib with with rvr at 110 bpmQrs 92 ms, Qtc 481 ms.  Echo-Left ventricle: The cavity size was normal. Systolic function was normal. The estimated ejection fraction was in the range of 55% to 60%. Wall motion was normal; there were no regional wall motion abnormalities. - Atrial septum: No defect or patent foramen ovale was identified.   Assessment and Plan:  1. Persistent afib failing DCCV 6/27  Per Dr, Olin Pia plan, will start flecainide 50 mg bid Will add low dose BB, metoprolol ER 12.5 mg qd Coninue eliquis 5 mg bid, there have been no missed doses.  !!!! Dr. Caryl Comes has asked that if pt does not convert with first shock, give 5 mg IV verapamil before 2nd shock.!!!!  This will also be scheduled when at Dr. Donnie Aho request when he  will be in the hospital.  F/u here in one week and if drug has not cardioverted pt will schedule cardioversion.

## 2014-12-01 ENCOUNTER — Ambulatory Visit (HOSPITAL_COMMUNITY)
Admission: RE | Admit: 2014-12-01 | Discharge: 2014-12-01 | Disposition: A | Discharge status: Home / Self Care | Payer: BLUE CROSS/BLUE SHIELD | Source: Ambulatory Visit | Attending: Nurse Practitioner | Admitting: Nurse Practitioner

## 2014-12-01 ENCOUNTER — Other Ambulatory Visit: Payer: Self-pay

## 2014-12-01 ENCOUNTER — Encounter (HOSPITAL_COMMUNITY): Payer: Self-pay | Admitting: Nurse Practitioner

## 2014-12-01 VITALS — BP 102/70 | HR 53 | Ht 68.0 in | Wt 138.4 lb

## 2014-12-01 DIAGNOSIS — Z79899 Other long term (current) drug therapy: Secondary | ICD-10-CM | POA: Insufficient documentation

## 2014-12-01 DIAGNOSIS — I480 Paroxysmal atrial fibrillation: Secondary | ICD-10-CM | POA: Diagnosis present

## 2014-12-01 NOTE — Progress Notes (Signed)
Came in today for repeat EKG on flecainide 50 mg bid and set up for cardioversion. Pt converted on Flecainide. EKG shows sinus brady 53 bpm.Pr int 53 bpm, QRS 94 ms, Qtc 448 ms. No DCCV needed. F/u with Dr. Caryl Comes in one month. Tolerating flecainide/metoprolol well.Feels better in SR.

## 2014-12-01 NOTE — Patient Instructions (Signed)
Medication Instructions:  Your physician recommends that you continue on your current medications as directed. Please refer to the Current Medication list given to you today.      Labwork: None ordered  Testing/Procedures: None ordered  Follow-Up: Your physician recommends that you schedule a follow-up appointment  with Dr Caryl Comes as scheduled.  If date and time are not good please call the office at [phone] and ask for Melissa to reschedule   Any Other Special Instructions Will Be Listed Below (If Applicable).

## 2014-12-25 ENCOUNTER — Encounter: Payer: Self-pay | Admitting: Internal Medicine

## 2014-12-25 ENCOUNTER — Ambulatory Visit (INDEPENDENT_AMBULATORY_CARE_PROVIDER_SITE_OTHER): Payer: BLUE CROSS/BLUE SHIELD | Admitting: Internal Medicine

## 2014-12-25 VITALS — BP 124/70 | HR 87 | Ht 68.0 in | Wt 139.0 lb

## 2014-12-25 DIAGNOSIS — I480 Paroxysmal atrial fibrillation: Secondary | ICD-10-CM

## 2014-12-25 DIAGNOSIS — Z01812 Encounter for preprocedural laboratory examination: Secondary | ICD-10-CM | POA: Diagnosis not present

## 2014-12-25 MED ORDER — FLECAINIDE ACETATE 100 MG PO TABS: 100.0000 mg | ORAL_TABLET | Freq: Two times a day (BID) | ORAL | Status: DC

## 2014-12-25 NOTE — Patient Instructions (Addendum)
Medication Instructions:  Your physician recommends that you continue on your current medications as directed. Please refer to the Current Medication list given to you today.  Labwork: Pre procedure labs on: 01/06/15  Testing/Procedures: Your physician has recommended that you have a Cardioversion (DCCV). Electrical Cardioversion uses a jolt of electricity to your heart either through paddles or wired patches attached to your chest. This is a controlled, usually prescheduled, procedure. Defibrillation is done under light anesthesia in the hospital, and you usually go home the day of the procedure. This is done to get your heart back into a normal rhythm. You are not awake for the procedure. Please see the instructions below.  Follow-Up: Your physician recommends that you schedule a follow-up appointment in: 4-6 weeks (from 8/19) with Roderic Palau, NP.   Any Other Special Instructions Will Be Listed Below (If Applicable). Your provider has recommended a cardioversion.   You are scheduled for a cardioversion on 01/08/15 at 8:00 a.m. with Dr. Marlou Porch or associates. Please go to [facility]  at 6:30 a.m..  Enter through the [place] not have any food or drink after midnight the night before your procedure.  You may take your medicines with a sip of water on the day of your procedure. Hold Flomax until after procedure. You will need someone to drive you home following your procedure.   Call the [place] office at [phone] if you have any questions, problems or concerns.     Electrical Cardioversion Electrical cardioversion is the delivery of a jolt of electricity to change the rhythm of the heart. Sticky patches or metal paddles are placed on the chest to deliver the electricity from a device. This is done to restore a normal rhythm. A rhythm that is too fast or not regular keeps the heart from pumping well. Electrical cardioversion is done in  an emergency if:   There is low or no blood pressure as a result of the heart rhythm.   Normal rhythm must be restored as fast as possible to protect the brain and heart from further damage.   It may save a life. Cardioversion may be done for heart rhythms that are not immediately life threatening, such as atrial fibrillation or flutter, in which:   The heart is beating too fast or is not regular.   Medicine to change the rhythm has not worked.   It is safe to wait in order to allow time for preparation.  Symptoms of the abnormal rhythm are bothersome.  The risk of stroke and other serious problems can be reduced.  LET [facility] CARE PROVIDER KNOW ABOUT:   Any allergies you have.  All medicines you are taking, including vitamins, herbs, eye drops, creams, and over-the-counter medicines.  Previous problems you or members of your family have had with the use of anesthetics.   Any blood disorders you have.   Previous surgeries you have had.   Medical conditions you have.   RISKS AND COMPLICATIONS  Generally, this is a safe procedure. However, problems can occur and include:   Breathing problems related to the anesthetic used.  A blood clot that breaks free and travels to other parts of your body. This could cause a stroke or other problems. The risk of this is lowered by use of blood-thinning medicine (anticoagulant) prior to the procedure.  Cardiac arrest (rare).   BEFORE THE PROCEDURE   You may have tests to detect blood clots in your  heart and to evaluate heart function.  You may start taking anticoagulants so your blood does not clot as easily.   Medicines may be given to help stabilize your heart rate and rhythm.   PROCEDURE  You will be given medicine through an IV tube to reduce discomfort and make you sleepy (sedative).   An electrical shock will be delivered.   AFTER THE PROCEDURE Your heart rhythm will be watched to make sure it does not  change. You will need someone to drive you home.

## 2014-12-25 NOTE — Progress Notes (Signed)
Patient Care Team: Leanna Battles, MD as PCP - General (Internal Medicine)   HPI  Robert Hart is a 64 y.o. male Seen in follow-up for atrial fibrillation; after anticoagulation with apixaban he underwent cardioversion 6/16 This failed to convert. He was started on flecainide and converted spontaneously. He felt better in sinus rhythm. He still feels some better now.  Records and Results Reviewed Echocardiogram 5/16 normal LV function and normal left atrial size Holter monitor reasonable heart rate excursion  Past Medical History  Diagnosis Date  . Dyspnea   . Nocturia   . Urinary hesitancy   . Back pain   . Arthritis   . ED (erectile dysfunction)   . PE (pulmonary embolism)   . Hemorrhoids   . Insomnia   . Shoulder pain, bilateral   . Rhinitis   . GERD (gastroesophageal reflux disease)   . Hyperlipidemia     Past Surgical History  Procedure Laterality Date  . Tonsillectomy and adenoidectomy    . Vasectomy  1989  . Colonoscopy    . Cardioversion N/A 11/16/2014    Procedure: CARDIOVERSION;  Surgeon: Sanda Brandyce Dimario, MD;  Location: [place];  Service: Cardiovascular;  Laterality: N/A;    Current Outpatient Prescriptions  Medication Sig Dispense Refill  . ELIQUIS 5 MG TABS tablet Take 1 tablet (5 mg total) by mouth 2 (two) times daily. 60 tablet 1  . flecainide (TAMBOCOR) 50 MG tablet Take 1 tablet (50 mg total) by mouth 2 (two) times daily. 60 tablet 1  . metoprolol succinate (TOPROL-XL) 25 MG 24 hr tablet Take 0.5 tablets (12.5 mg total) by mouth daily. 30 tablet 1  . Multiple Vitamins-Minerals (MULTIVITAMIN & MINERAL PO) Take 1 tablet by mouth daily.    . simvastatin (ZOCOR) 80 MG tablet Take 40 mg by mouth daily at 6 PM.   11  . tamsulosin (FLOMAX) 0.4 MG CAPS capsule Take 0.4 mg by mouth daily.   11  . traMADol (ULTRAM) 50 MG tablet Take 50 mg by mouth every 6 (six) hours as needed for moderate pain or severe pain.   4  . traZODone (DESYREL) 100 MG  tablet Take 100 mg by mouth at bedtime.   11  . zaleplon (SONATA) 5 MG capsule Take 5 mg by mouth at bedtime as needed for sleep.     No current facility-administered medications for this visit.    No Known Allergies    Review of Systems negative except from HPI and PMH  Physical Exam BP 124/70 mmHg  Pulse 87  Ht 5\' 8"  (1.727 m)  Wt 139 lb (63.05 kg)  BMI 21.14 kg/m2 Well developed and well nourished in no acute distress HENT normal E scleral and icterus clear Neck Supple JVP flat; carotids brisk and full Clear to ausculation irregularly irregular  r rate and rhythm, no murmurs gallops or rub Soft with active bowel sounds No clubbing cyanosis  Edema Alert and oriented, grossly normal motor and sensory function Skin Warm and Dry   ECG demonstrates atrial fibrillation at a rate of 87 intervals-/09/N/A Assessment and  Plan Atrial fibrillation-Persistent  The patient has persistent atrial fibrillation. He transitioned to sinus rhythm and was much better at that point. He feels still some better suggesting that rate control may be a part of his symptom relief.  We have discussed various strategies including up titration of flecainide night, initiation of dofetilide with a target of catheter ablation. This is all predicated on being able to  demonstrate that  atrial fibrillation is symptomatic as was suggested by the transitions into sinus rhythm.  For now we will increase his flecainide. We will undertake cardioversion.  We spent more than 50% of our >45 min visit in face to face counseling regarding the above

## 2014-12-29 ENCOUNTER — Ambulatory Visit: Payer: BLUE CROSS/BLUE SHIELD | Admitting: Internal Medicine

## 2014-12-31 ENCOUNTER — Ambulatory Visit: Payer: BLUE CROSS/BLUE SHIELD | Admitting: Internal Medicine

## 2015-01-06 ENCOUNTER — Other Ambulatory Visit (INDEPENDENT_AMBULATORY_CARE_PROVIDER_SITE_OTHER): Payer: BLUE CROSS/BLUE SHIELD | Admitting: *Deleted

## 2015-01-06 DIAGNOSIS — I480 Paroxysmal atrial fibrillation: Secondary | ICD-10-CM | POA: Diagnosis not present

## 2015-01-06 DIAGNOSIS — Z01812 Encounter for preprocedural laboratory examination: Secondary | ICD-10-CM

## 2015-01-06 LAB — CBC WITH DIFFERENTIAL/PLATELET
BASOS PCT: 0.5 % (ref 0.0–3.0)
Basophils Absolute: 0 10*3/uL (ref 0.0–0.1)
EOS PCT: 1 % (ref 0.0–5.0)
Eosinophils Absolute: 0.1 10*3/uL (ref 0.0–0.7)
HEMATOCRIT: 37.5 % — AB (ref 39.0–52.0)
HEMOGLOBIN: 12.7 g/dL — AB (ref 13.0–17.0)
Lymphocytes Relative: 19.4 % (ref 12.0–46.0)
Lymphs Abs: 1.2 10*3/uL (ref 0.7–4.0)
MCHC: 33.8 g/dL (ref 30.0–36.0)
MCV: 91.3 fl (ref 78.0–100.0)
MONO ABS: 0.4 10*3/uL (ref 0.1–1.0)
MONOS PCT: 6 % (ref 3.0–12.0)
Neutro Abs: 4.6 10*3/uL (ref 1.4–7.7)
Neutrophils Relative %: 73.1 % (ref 43.0–77.0)
Platelets: 156 10*3/uL (ref 150.0–400.0)
RBC: 4.11 Mil/uL — ABNORMAL LOW (ref 4.22–5.81)
RDW: 13.5 % (ref 11.5–15.5)
WBC: 6.2 10*3/uL (ref 4.0–10.5)

## 2015-01-06 LAB — BASIC METABOLIC PANEL
BUN: 19 mg/dL (ref 6–23)
CHLORIDE: 101 meq/L (ref 96–112)
CO2: 31 mEq/L (ref 19–32)
Calcium: 9.5 mg/dL (ref 8.4–10.5)
Creatinine, Ser: 1.16 mg/dL (ref 0.40–1.50)
GFR: 67.37 mL/min (ref >60.00)
Glucose, Bld: 118 mg/dL — ABNORMAL HIGH (ref 70–99)
POTASSIUM: 3.7 meq/L (ref 3.5–5.1)
Sodium: 138 mEq/L (ref 135–145)

## 2015-01-07 MED ORDER — SODIUM CHLORIDE 0.9 % IV SOLN: INTRAVENOUS | Status: DC

## 2015-01-08 ENCOUNTER — Ambulatory Visit (HOSPITAL_COMMUNITY)
Admission: RE | Admit: 2015-01-08 | Discharge: 2015-01-08 | Disposition: A | Discharge status: Home / Self Care | Payer: BLUE CROSS/BLUE SHIELD | Source: Ambulatory Visit | Attending: Cardiology | Admitting: Cardiology

## 2015-01-08 ENCOUNTER — Encounter (HOSPITAL_COMMUNITY)
Admission: RE | Disposition: A | Discharge status: Home / Self Care | Payer: Self-pay | Source: Ambulatory Visit | Attending: Cardiology

## 2015-01-08 DIAGNOSIS — I480 Paroxysmal atrial fibrillation: Secondary | ICD-10-CM

## 2015-01-08 DIAGNOSIS — Z5309 Procedure and treatment not carried out because of other contraindication: Secondary | ICD-10-CM | POA: Insufficient documentation

## 2015-01-08 DIAGNOSIS — I4891 Unspecified atrial fibrillation: Secondary | ICD-10-CM | POA: Diagnosis present

## 2015-01-08 SURGERY — CANCELLED PROCEDURE

## 2015-01-08 NOTE — Progress Notes (Signed)
Patient admitted to Endo unit, placed on monitor and rhythm showed sinus brady. EKG confirmed. MD said to keep medication dosages the same and patient may go home and follow up as needed with Dr. Caryl Comes.

## 2015-02-15 ENCOUNTER — Ambulatory Visit (HOSPITAL_COMMUNITY)
Admission: RE | Admit: 2015-02-15 | Discharge: 2015-02-15 | Disposition: A | Discharge status: Home / Self Care | Payer: BLUE CROSS/BLUE SHIELD | Source: Ambulatory Visit | Attending: Nurse Practitioner | Admitting: Nurse Practitioner

## 2015-02-15 ENCOUNTER — Encounter (HOSPITAL_COMMUNITY): Payer: Self-pay | Admitting: Nurse Practitioner

## 2015-02-15 VITALS — BP 108/72 | HR 52 | Ht 68.0 in | Wt 137.8 lb

## 2015-02-15 DIAGNOSIS — Z86711 Personal history of pulmonary embolism: Secondary | ICD-10-CM | POA: Diagnosis not present

## 2015-02-15 DIAGNOSIS — Z7902 Long term (current) use of antithrombotics/antiplatelets: Secondary | ICD-10-CM | POA: Insufficient documentation

## 2015-02-15 DIAGNOSIS — Z79899 Other long term (current) drug therapy: Secondary | ICD-10-CM | POA: Diagnosis not present

## 2015-02-15 DIAGNOSIS — I4819 Other persistent atrial fibrillation: Secondary | ICD-10-CM

## 2015-02-15 DIAGNOSIS — I481 Persistent atrial fibrillation: Secondary | ICD-10-CM | POA: Diagnosis present

## 2015-02-15 DIAGNOSIS — E785 Hyperlipidemia, unspecified: Secondary | ICD-10-CM | POA: Insufficient documentation

## 2015-02-15 DIAGNOSIS — K219 Gastro-esophageal reflux disease without esophagitis: Secondary | ICD-10-CM | POA: Insufficient documentation

## 2015-02-15 NOTE — Progress Notes (Signed)
Patient ID: Robert Hart, male   DOB: [birth year], 64 y.o.   MRN: [id number]     Primary Care Physician: Donnajean Lopes, MD Referring Physician: Dr. Nelly Rout is a 64 y.o. male with a h/o new onset afib initially evaluated by Dr. Caryl Comes 5/20.  He was set up for dccv but did not convert out. He as started on flecainide and set up for dccv again but had converted. He has been maintaining SR on flecainide. He feels well. He has tolerated flecainide. No bleeding issues.  Today, he denies symptoms of palpitations, chest pain, shortness of breath, orthopnea, PND, lower extremity edema, dizziness, presyncope, syncope, or neurologic sequela. Positive for fatigue associated with afib. The patient is tolerating medications without difficulties and is otherwise without complaint today.   Past Medical History  Diagnosis Date  . Dyspnea   . Nocturia   . Urinary hesitancy   . Back pain   . Arthritis   . ED (erectile dysfunction)   . PE (pulmonary embolism)   . Hemorrhoids   . Insomnia   . Shoulder pain, bilateral   . Rhinitis   . GERD (gastroesophageal reflux disease)   . Hyperlipidemia    Past Surgical History  Procedure Laterality Date  . Tonsillectomy and adenoidectomy    . Vasectomy  1989  . Colonoscopy    . Cardioversion N/A 11/16/2014    Procedure: CARDIOVERSION;  Surgeon: Sanda Klein, MD;  Location: [facility];  Service: Cardiovascular;  Laterality: N/A;    Current Outpatient Prescriptions  Medication Sig Dispense Refill  . ELIQUIS 5 MG TABS tablet Take 1 tablet (5 mg total) by mouth 2 (two) times daily. 60 tablet 1  . flecainide (TAMBOCOR) 100 MG tablet Take 1 tablet (100 mg total) by mouth 2 (two) times daily. 60 tablet 3  . metoprolol succinate (TOPROL-XL) 25 MG 24 hr tablet Take 0.5 tablets (12.5 mg total) by mouth daily. 30 tablet 1  . Multiple Vitamins-Minerals (MULTIVITAMIN & MINERAL PO) Take 1 tablet by mouth daily.    . simvastatin (ZOCOR) 80 MG tablet  Take 40 mg by mouth daily at 6 PM.   11  . traMADol (ULTRAM) 50 MG tablet Take 50 mg by mouth every 6 (six) hours as needed for moderate pain or severe pain.   4  . traZODone (DESYREL) 100 MG tablet Take 100 mg by mouth at bedtime.   11  . zaleplon (SONATA) 5 MG capsule Take 5 mg by mouth at bedtime as needed for sleep.    . tamsulosin (FLOMAX) 0.4 MG CAPS capsule Take 0.4 mg by mouth daily.   11   No current facility-administered medications for this encounter.    No Known Allergies  Social History   Social History  . Marital Status: Married    Spouse Name: N/A  . Number of Children: N/A  . Years of Education: N/A   Occupational History  . Not on file.   Social History Main Topics  . Smoking status: Never Smoker   . Smokeless tobacco: Not on file  . Alcohol Use: Not on file  . Drug Use: Not on file  . Sexual Activity: Not on file   Other Topics Concern  . Not on file   Social History Narrative    Family History  Problem Relation Age of Onset  . Breast cancer Mother   . Prostate cancer Father   . COPD Father   . Lung disease Father  ROS- All systems are reviewed and negative except as per the HPI above  Physical Exam: Filed Vitals:   02/15/15 0844  BP: 108/72  Pulse: 52  Height: 5\' 8"  (1.727 m)  Weight: 137 lb 12.8 oz (62.506 kg)    GEN- The patient is well appearing, alert and oriented x 3 today.   Head- normocephalic, atraumatic Eyes-  Sclera clear, conjunctiva pink Ears- hearing intact Oropharynx- clear Neck- supple, no JVP Lymph- no cervical lymphadenopathy Lungs- Clear to ausculation bilaterally, normal work of breathing Heart- irregular rate and rhythm, no murmurs, rubs or gallops, PMI not laterally displaced GI- soft, NT, ND, + BS Extremities- no clubbing, cyanosis, or edema MS- no significant deformity or atrophy Skin- no rash or lesion Psych- euthymic mood, full affect Neuro- strength and sensation are intact  EKG- Sinus brady at 52  bpm, biatrial enlargement, rightward axis, Pr int 164 ms, QRS 108 ms, QTc 465 ms  Echo-Left ventricle: The cavity size was normal. Systolic function was normal. The estimated ejection fraction was in the range of 55% to 60%. Wall motion was normal; there were no regional wall motion abnormalities. - Atrial septum: No defect or patent foramen ovale was identified.  Assessment and Plan:  1. Persistent afib Maintaining SR  Continue flecainide/low dose metoprolol Continue eliquis  F/u in afib clinic in 3 months   [name], [facility] [address] [place], [place] [address] [phone]

## 2015-03-24 ENCOUNTER — Other Ambulatory Visit: Payer: Self-pay | Admitting: Internal Medicine

## 2015-03-25 ENCOUNTER — Other Ambulatory Visit: Payer: Self-pay | Admitting: Internal Medicine

## 2015-03-25 MED ORDER — METOPROLOL SUCCINATE ER 25 MG PO TB24: 12.5000 mg | ORAL_TABLET | Freq: Every day | ORAL | Status: DC

## 2015-05-05 ENCOUNTER — Other Ambulatory Visit: Payer: Self-pay | Admitting: Internal Medicine

## 2015-05-18 ENCOUNTER — Encounter (HOSPITAL_COMMUNITY): Payer: Self-pay | Admitting: Nurse Practitioner

## 2015-05-18 ENCOUNTER — Ambulatory Visit (HOSPITAL_COMMUNITY)
Admission: RE | Admit: 2015-05-18 | Discharge: 2015-05-18 | Disposition: A | Discharge status: Home / Self Care | Payer: BLUE CROSS/BLUE SHIELD | Source: Ambulatory Visit | Attending: Nurse Practitioner | Admitting: Nurse Practitioner

## 2015-05-18 VITALS — BP 102/72 | HR 52 | Ht 68.0 in | Wt 137.0 lb

## 2015-05-18 DIAGNOSIS — I4891 Unspecified atrial fibrillation: Secondary | ICD-10-CM | POA: Diagnosis not present

## 2015-05-18 DIAGNOSIS — I481 Persistent atrial fibrillation: Secondary | ICD-10-CM | POA: Diagnosis not present

## 2015-05-18 DIAGNOSIS — I4819 Other persistent atrial fibrillation: Secondary | ICD-10-CM

## 2015-05-18 MED ORDER — PRAVASTATIN SODIUM 40 MG PO TABS: 40.0000 mg | ORAL_TABLET | Freq: Every day | ORAL | Status: DC

## 2015-05-18 MED ORDER — FLECAINIDE ACETATE 50 MG PO TABS: ORAL_TABLET | ORAL | Status: DC

## 2015-05-18 NOTE — Patient Instructions (Signed)
Your physician has recommended you make the following change in your medication:  1)Decrease Flecainide to 75mg  (1.5 tablets) twice daily then on 06/01/2015 decrease to 50mg  twice daily

## 2015-05-18 NOTE — Progress Notes (Signed)
Patient ID: Robert Hart, male   DOB: Oct 15, 1950, 64 y.o.   MRN: [id number]  Primary Care Physician: Donnajean Lopes, MD Referring Physician: Dr. Nelly Rout is a 64 y.o. male with a h/o afib on flecainide 100 mg bid and maintaining SR without any breakthrough afib. He would like to try to reduce dose of flecainide to see if he can maintain rhythm on lower dose.He thinks he might feel better on less flecainide. He is having memory issues and asked if may be related to flecainide, which I do not commonly see or hear related to flecainide. No issues with blood thinner.  Today, he denies symptoms of palpitations, chest pain, shortness of breath, orthopnea, PND, lower extremity edema, dizziness, presyncope, syncope, or neurologic sequela. The patient is tolerating medications without difficulties and is otherwise without complaint today.   Past Medical History  Diagnosis Date  . Dyspnea   . Nocturia   . Urinary hesitancy   . Back pain   . Arthritis   . ED (erectile dysfunction)   . PE (pulmonary embolism)   . Hemorrhoids   . Insomnia   . Shoulder pain, bilateral   . Rhinitis   . GERD (gastroesophageal reflux disease)   . Hyperlipidemia    Past Surgical History  Procedure Laterality Date  . Tonsillectomy and adenoidectomy    . Vasectomy  1989  . Colonoscopy    . Cardioversion N/A 11/16/2014    Procedure: CARDIOVERSION;  Surgeon: Sanda Klein, MD;  Location: [facility];  Service: Cardiovascular;  Laterality: N/A;    Current Outpatient Prescriptions  Medication Sig Dispense Refill  . doxycycline (DORYX) 100 MG EC tablet Take 100 mg by mouth 2 (two) times daily.    Marland Kitchen ELIQUIS 5 MG TABS tablet Take 1 tablet (5 mg total) by mouth 2 (two) times daily. 60 tablet 1  . flecainide (TAMBOCOR) 50 MG tablet Take 1.5 tablet (75mg ) twice daily for 2 weeks then decrease to 1 tablet (50mg ) twice daily 90 tablet 3  . metoprolol succinate (TOPROL-XL) 25 MG 24 hr tablet Take 0.5 tablets  (12.5 mg total) by mouth daily. 30 tablet 8  . Multiple Vitamins-Minerals (MULTIVITAMIN & MINERAL PO) Take 1 tablet by mouth daily.    . tamsulosin (FLOMAX) 0.4 MG CAPS capsule Take 0.4 mg by mouth daily.   11  . traMADol (ULTRAM) 50 MG tablet Take 50 mg by mouth every 6 (six) hours as needed for moderate pain or severe pain.   4  . traZODone (DESYREL) 100 MG tablet Take 100 mg by mouth at bedtime.   11  . zaleplon (SONATA) 5 MG capsule Take 5 mg by mouth at bedtime as needed for sleep.    . pravastatin (PRAVACHOL) 40 MG tablet Take 1 tablet (40 mg total) by mouth daily.     No current facility-administered medications for this encounter.    No Known Allergies  Social History   Social History  . Marital Status: Married    Spouse Name: N/A  . Number of Children: N/A  . Years of Education: N/A   Occupational History  . Not on file.   Social History Main Topics  . Smoking status: Never Smoker   . Smokeless tobacco: Not on file  . Alcohol Use: Not on file  . Drug Use: Not on file  . Sexual Activity: Not on file   Other Topics Concern  . Not on file   Social History Narrative    Family History  Problem Relation Age of Onset  . Breast cancer Mother   . Prostate cancer Father   . COPD Father   . Lung disease Father     ROS- All systems are reviewed and negative except as per the HPI above  Physical Exam: Filed Vitals:   05/18/15 0914  BP: 102/72  Pulse: 52  Height: 5\' 8"  (1.727 m)  Weight: 137 lb (62.143 kg)    GEN- The patient is well appearing, alert and oriented x 3 today.   Head- normocephalic, atraumatic Eyes-  Sclera clear, conjunctiva pink Ears- hearing intact Oropharynx- clear Neck- supple, no JVP Lymph- no cervical lymphadenopathy Lungs- Clear to ausculation bilaterally, normal work of breathing Heart- Regular rate and rhythm, no murmurs, rubs or gallops, PMI not laterally displaced GI- soft, NT, ND, + BS Extremities- no clubbing, cyanosis, or  edema MS- no significant deformity or atrophy Skin- no rash or lesion Psych- euthymic mood, full affect Neuro- strength and sensation are intact  EKG- Sinus brady at 52 bpm, pr int 186 ms, qrs int 108 ms, qtc 450 ms  Assessment and Plan:  1. Afib Maintaining SR on flecainide Pt would  like to reduce dose to see if he could maintain SR Decrease dose to 75 mg bid x 2 weeks then reduce to 50 mg bid Return to office in one month or call if issues sooner Continue eliquis 5 mg bid  Continue metoprolol to 12.5 mg bid   Butch Penny C. Tarique Loveall, [facility] [address] [place], [place] [address] [phone]

## 2015-06-16 ENCOUNTER — Telehealth (HOSPITAL_COMMUNITY): Payer: Self-pay | Admitting: *Deleted

## 2015-06-16 MED ORDER — FLECAINIDE ACETATE 100 MG PO TABS: 100.0000 mg | ORAL_TABLET | Freq: Two times a day (BID) | ORAL | Status: DC

## 2015-06-16 NOTE — Telephone Encounter (Signed)
Pt called in stating he never reduced dose of flecainide due to having difficulty splitting pill to make 75mg  BID.  Since was his idea to decrease dose will just stay on 100mg  bid and follow up in March. Patient instructed to call if further issues

## 2015-06-21 ENCOUNTER — Ambulatory Visit (HOSPITAL_COMMUNITY): Payer: BLUE CROSS/BLUE SHIELD | Admitting: Nurse Practitioner

## 2015-06-22 ENCOUNTER — Telehealth: Payer: Self-pay | Admitting: Internal Medicine

## 2015-06-22 NOTE — Telephone Encounter (Signed)
New message   Pt wants rn to call him   Pt wants life insurance

## 2015-06-23 ENCOUNTER — Encounter: Payer: Self-pay | Admitting: Internal Medicine

## 2015-06-23 NOTE — Telephone Encounter (Signed)
I called and spoke with the patient. 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 [employer] is giving him some trouble with this because there is mention of a PE on his record from Dr. Caryl Comes. I advised the patient, in reviewing his chart, that his initial consult with Dr. Caryl Comes on 10/09/14 had a "PE" on his "Past Medical History." The patient states that he has never had a PE and that he is needing a letter from Dr. Caryl Comes stating this is so. I advised the patient I will review with Dr. Caryl Comes and call him back. If we are able to do a letter, he would like to pick this up in the office.

## 2015-06-24 NOTE — Telephone Encounter (Signed)
Letter completed. Patient aware this is at the front desk for pick up.

## 2015-07-30 ENCOUNTER — Other Ambulatory Visit (HOSPITAL_COMMUNITY): Payer: Self-pay | Admitting: Nurse Practitioner

## 2015-07-30 ENCOUNTER — Other Ambulatory Visit (HOSPITAL_COMMUNITY): Payer: Self-pay | Admitting: *Deleted

## 2015-07-30 MED ORDER — FLECAINIDE ACETATE 100 MG PO TABS: 100.0000 mg | ORAL_TABLET | Freq: Two times a day (BID) | ORAL | Status: DC

## 2015-08-10 ENCOUNTER — Encounter (HOSPITAL_COMMUNITY): Payer: Self-pay | Admitting: *Deleted

## 2015-08-16 ENCOUNTER — Ambulatory Visit (HOSPITAL_COMMUNITY)
Admission: RE | Admit: 2015-08-16 | Discharge: 2015-08-16 | Disposition: A | Discharge status: Home / Self Care | Payer: BLUE CROSS/BLUE SHIELD | Source: Ambulatory Visit | Attending: Nurse Practitioner | Admitting: Nurse Practitioner

## 2015-08-16 ENCOUNTER — Encounter (HOSPITAL_COMMUNITY): Payer: Self-pay | Admitting: Nurse Practitioner

## 2015-08-16 VITALS — BP 110/74 | HR 59 | Ht 68.0 in | Wt 136.2 lb

## 2015-08-16 DIAGNOSIS — Z79899 Other long term (current) drug therapy: Secondary | ICD-10-CM | POA: Insufficient documentation

## 2015-08-16 DIAGNOSIS — Z7901 Long term (current) use of anticoagulants: Secondary | ICD-10-CM | POA: Diagnosis not present

## 2015-08-16 DIAGNOSIS — I4891 Unspecified atrial fibrillation: Secondary | ICD-10-CM | POA: Insufficient documentation

## 2015-08-16 DIAGNOSIS — Z86711 Personal history of pulmonary embolism: Secondary | ICD-10-CM | POA: Insufficient documentation

## 2015-08-16 DIAGNOSIS — I481 Persistent atrial fibrillation: Secondary | ICD-10-CM | POA: Diagnosis not present

## 2015-08-16 DIAGNOSIS — E785 Hyperlipidemia, unspecified: Secondary | ICD-10-CM | POA: Insufficient documentation

## 2015-08-16 DIAGNOSIS — I4819 Other persistent atrial fibrillation: Secondary | ICD-10-CM

## 2015-08-16 DIAGNOSIS — K219 Gastro-esophageal reflux disease without esophagitis: Secondary | ICD-10-CM | POA: Diagnosis not present

## 2015-08-16 NOTE — Progress Notes (Signed)
Patient ID: Robert Hart, male   DOB: May 23, 1950, 65 y.o.   MRN: [id number]     Primary Care Physician: Donnajean Lopes, MD Referring Physician: Dr. Nelly Rout is a 65 y.o. male with a h/o afib on flecainide 100 mg bid and maintaining SR without any breakthrough afib. On last visit, he wanted to try to reduce dose of flecainide to see if he can maintain rhythm on lower dose.He thought he might feel better on less flecainide. He was having memory issues and asked if it may be related to flecainide, which I do not commonly see or hear related to flecainide. No issues with blood thinner.  He returns 3/27 and reports that he did not try to reduce the dose of flecainide since everything was going so well. He still reports no breakthrough afib. He just finished a memory assessment thru a physician in [facility] last week and is waiting the results. He feels that he did ok.  Today, he denies symptoms of palpitations, chest pain, shortness of breath, orthopnea, PND, lower extremity edema, dizziness, presyncope, syncope, or neurologic sequela. The patient is tolerating medications without difficulties and is otherwise without complaint today.   Past Medical History  Diagnosis Date  . Dyspnea   . Nocturia   . Urinary hesitancy   . Back pain   . Arthritis   . ED (erectile dysfunction)   . PE (pulmonary embolism)   . Hemorrhoids   . Insomnia   . Shoulder pain, bilateral   . Rhinitis   . GERD (gastroesophageal reflux disease)   . Hyperlipidemia    Past Surgical History  Procedure Laterality Date  . Tonsillectomy and adenoidectomy    . Vasectomy  1989  . Colonoscopy    . Cardioversion N/A 11/16/2014    Procedure: CARDIOVERSION;  Surgeon: Sanda Klein, MD;  Location: [facility];  Service: Cardiovascular;  Laterality: N/A;    Current Outpatient Prescriptions  Medication Sig Dispense Refill  . doxycycline (DORYX) 100 MG EC tablet Take 100 mg by mouth 2 (two) times daily.      Marland Kitchen ELIQUIS 5 MG TABS tablet Take 1 tablet (5 mg total) by mouth 2 (two) times daily. 60 tablet 1  . flecainide (TAMBOCOR) 100 MG tablet Take 1 tablet (100 mg total) by mouth 2 (two) times daily. 60 tablet 6  . metoprolol succinate (TOPROL-XL) 25 MG 24 hr tablet Take 0.5 tablets (12.5 mg total) by mouth daily. 30 tablet 8  . Multiple Vitamins-Minerals (MULTIVITAMIN & MINERAL PO) Take 1 tablet by mouth daily.    . pravastatin (PRAVACHOL) 40 MG tablet Take 1 tablet (40 mg total) by mouth daily.    . tamsulosin (FLOMAX) 0.4 MG CAPS capsule Take 0.4 mg by mouth daily.   11  . traMADol (ULTRAM) 50 MG tablet Take 50 mg by mouth every 6 (six) hours as needed for moderate pain or severe pain.   4  . traZODone (DESYREL) 100 MG tablet Take 100 mg by mouth at bedtime.   11  . zaleplon (SONATA) 5 MG capsule Take 5 mg by mouth at bedtime as needed for sleep.     No current facility-administered medications for this encounter.    No Known Allergies  Social History   Social History  . Marital Status: Married    Spouse Name: N/A  . Number of Children: N/A  . Years of Education: N/A   Occupational History  . Not on file.   Social History Main  Topics  . Smoking status: Never Smoker   . Smokeless tobacco: Not on file  . Alcohol Use: Not on file  . Drug Use: Not on file  . Sexual Activity: Not on file   Other Topics Concern  . Not on file   Social History Narrative    Family History  Problem Relation Age of Onset  . Breast cancer Mother   . Prostate cancer Father   . COPD Father   . Lung disease Father     ROS- All systems are reviewed and negative except as per the HPI above  Physical Exam: There were no vitals filed for this visit.  GEN- The patient is well appearing, alert and oriented x 3 today.   Head- normocephalic, atraumatic Eyes-  Sclera clear, conjunctiva pink Ears- hearing intact Oropharynx- clear Neck- supple, no JVP Lymph- no cervical lymphadenopathy Lungs- Clear  to ausculation bilaterally, normal work of breathing Heart- Regular rate and rhythm, no murmurs, rubs or gallops, PMI not laterally displaced GI- soft, NT, ND, + BS Extremities- no clubbing, cyanosis, or edema MS- no significant deformity or atrophy Skin- no rash or lesion Psych- euthymic mood, full affect Neuro- strength and sensation are intact  EKG- Sinus brady at 59 bpm, pr int 176 ms, qrs int 96 ms, qtc 433 ms  Assessment and Plan:  1. Afib Maintaining SR on flecainide Continue 100 mg bid  Return to office in 3 months Continue eliquis 5 mg bid  Continue metoprolol  12.5 mg bid   F/u in 3 months  Butch Penny C. Kairy Folsom, [facility] [address] [place], [place] [address] [phone]

## 2016-02-29 ENCOUNTER — Other Ambulatory Visit (HOSPITAL_COMMUNITY): Payer: Self-pay | Admitting: Nurse Practitioner

## 2016-03-30 ENCOUNTER — Other Ambulatory Visit (HOSPITAL_COMMUNITY): Payer: Self-pay | Admitting: Nurse Practitioner

## 2016-04-04 ENCOUNTER — Inpatient Hospital Stay (HOSPITAL_COMMUNITY)
Admission: RE | Admit: 2016-04-04 | Discharge: 2016-04-04 | Disposition: A | Discharge status: Home / Self Care | Payer: BLUE CROSS/BLUE SHIELD | Source: Ambulatory Visit | Attending: Nurse Practitioner | Admitting: Nurse Practitioner

## 2016-04-05 ENCOUNTER — Inpatient Hospital Stay (HOSPITAL_COMMUNITY)
Admission: RE | Admit: 2016-04-05 | Payer: BLUE CROSS/BLUE SHIELD | Source: Ambulatory Visit | Admitting: Nurse Practitioner

## 2016-04-10 ENCOUNTER — Encounter (HOSPITAL_COMMUNITY): Payer: Self-pay | Admitting: Nurse Practitioner

## 2016-04-10 ENCOUNTER — Ambulatory Visit (HOSPITAL_COMMUNITY)
Admission: RE | Admit: 2016-04-10 | Discharge: 2016-04-10 | Disposition: A | Discharge status: Home / Self Care | Payer: Medicare Other | Source: Ambulatory Visit | Attending: Nurse Practitioner | Admitting: Nurse Practitioner

## 2016-04-10 ENCOUNTER — Other Ambulatory Visit (HOSPITAL_COMMUNITY): Payer: Self-pay | Admitting: *Deleted

## 2016-04-10 VITALS — BP 120/78 | HR 46 | Ht 68.0 in | Wt 137.0 lb

## 2016-04-10 DIAGNOSIS — M1990 Unspecified osteoarthritis, unspecified site: Secondary | ICD-10-CM | POA: Diagnosis not present

## 2016-04-10 DIAGNOSIS — Z86711 Personal history of pulmonary embolism: Secondary | ICD-10-CM | POA: Diagnosis not present

## 2016-04-10 DIAGNOSIS — Z79899 Other long term (current) drug therapy: Secondary | ICD-10-CM | POA: Insufficient documentation

## 2016-04-10 DIAGNOSIS — Z7901 Long term (current) use of anticoagulants: Secondary | ICD-10-CM | POA: Insufficient documentation

## 2016-04-10 DIAGNOSIS — Z825 Family history of asthma and other chronic lower respiratory diseases: Secondary | ICD-10-CM | POA: Insufficient documentation

## 2016-04-10 DIAGNOSIS — R001 Bradycardia, unspecified: Secondary | ICD-10-CM | POA: Insufficient documentation

## 2016-04-10 DIAGNOSIS — I480 Paroxysmal atrial fibrillation: Secondary | ICD-10-CM

## 2016-04-10 DIAGNOSIS — K219 Gastro-esophageal reflux disease without esophagitis: Secondary | ICD-10-CM | POA: Insufficient documentation

## 2016-04-10 DIAGNOSIS — E785 Hyperlipidemia, unspecified: Secondary | ICD-10-CM | POA: Insufficient documentation

## 2016-04-10 DIAGNOSIS — I4891 Unspecified atrial fibrillation: Secondary | ICD-10-CM | POA: Insufficient documentation

## 2016-04-10 MED ORDER — FLECAINIDE ACETATE 100 MG PO TABS: 100.0000 mg | ORAL_TABLET | Freq: Two times a day (BID) | ORAL | 6 refills | Status: DC

## 2016-04-10 NOTE — Progress Notes (Signed)
Patient ID: MOUHAMADOU PROFFER, male   DOB: 10-May-1951, 65 y.o.   MRN: [id number]     Primary Care Physician: Donnajean Lopes, MD Referring Physician: Dr. Nelly Rout is a 65 y.o. male with a h/o afib on flecainide 100 mg bid and maintaining SR without any breakthrough afib. On last visit, he wanted to try to reduce dose of flecainide to see if he can maintain rhythm on lower dose.He thought he might feel better on less flecainide. He was having memory issues and asked if it may be related to flecainide, which I do not commonly see or hear related to flecainide. No issues with blood thinner.  He returns 3/27 and reports that he did not try to reduce the dose of flecainide since everything was going so well. He still reports no breakthrough afib. He just finished a memory assessment thru a physician in [facility] last week and is waiting the results. He feels that he did ok.  Returns to the afib clinic 11/20. He continues on flecainide and low dose metoprolol. No bleeding issues with eliquis 5 mg bid. He has not noticed any heart irregularity. No lightheadedness with bradycardia. He feels well. He said no memory issues when he was evaluated by the physician in [facility] earlier this year.  Today, he denies symptoms of palpitations, chest pain, shortness of breath, orthopnea, PND, lower extremity edema, dizziness, presyncope, syncope, or neurologic sequela. The patient is tolerating medications without difficulties and is otherwise without complaint today.   Past Medical History:  Diagnosis Date  . Arthritis   . Back pain   . Dyspnea   . ED (erectile dysfunction)   . GERD (gastroesophageal reflux disease)   . Hemorrhoids   . Hyperlipidemia   . Insomnia   . Nocturia   . PE (pulmonary embolism)   . Rhinitis   . Shoulder pain, bilateral   . Urinary hesitancy    Past Surgical History:  Procedure Laterality Date  . CARDIOVERSION N/A 11/16/2014   Procedure: CARDIOVERSION;  Surgeon:  Sanda Klein, MD;  Location: [facility];  Service: Cardiovascular;  Laterality: N/A;  . COLONOSCOPY    . TONSILLECTOMY AND ADENOIDECTOMY    . VASECTOMY  1989    Current Outpatient Prescriptions  Medication Sig Dispense Refill  . ELIQUIS 5 MG TABS tablet Take 1 tablet (5 mg total) by mouth 2 (two) times daily. 60 tablet 1  . flecainide (TAMBOCOR) 100 MG tablet TAKE 1 TABLET TWICE DAILY. 60 tablet 0  . metoprolol succinate (TOPROL-XL) 25 MG 24 hr tablet Take 0.5 tablets (12.5 mg total) by mouth daily. 30 tablet 8  . Multiple Vitamins-Minerals (MULTIVITAMIN & MINERAL PO) Take 1 tablet by mouth daily.    . pravastatin (PRAVACHOL) 40 MG tablet Take 1 tablet (40 mg total) by mouth daily.    . traMADol (ULTRAM) 50 MG tablet Take 50 mg by mouth every 6 (six) hours as needed for moderate pain or severe pain.   4  . traZODone (DESYREL) 100 MG tablet Take 100 mg by mouth at bedtime.   11   No current facility-administered medications for this encounter.     No Known Allergies  Social History   Social History  . Marital status: Married    Spouse name: N/A  . Number of children: N/A  . Years of education: N/A   Occupational History  . Not on file.   Social History Main Topics  . Smoking status: Never Smoker  .  Smokeless tobacco: Not on file  . Alcohol use Not on file  . Drug use: Unknown  . Sexual activity: Not on file   Other Topics Concern  . Not on file   Social History Narrative  . No narrative on file    Family History  Problem Relation Age of Onset  . Breast cancer Mother   . Prostate cancer Father   . COPD Father   . Lung disease Father     ROS- All systems are reviewed and negative except as per the HPI above  Physical Exam: Vitals:   04/10/16 1041  BP: 120/78  Pulse: (!) 46  Weight: 137 lb (62.1 kg)  Height: 5\' 8"  (1.727 m)    GEN- The patient is well appearing, alert and oriented x 3 today.   Head- normocephalic, atraumatic Eyes-  Sclera clear,  conjunctiva pink Ears- hearing intact Oropharynx- clear Neck- supple, no JVP Lymph- no cervical lymphadenopathy Lungs- Clear to ausculation bilaterally, normal work of breathing Heart- Regular rate and rhythm, no murmurs, rubs or gallops, PMI not laterally displaced GI- soft, NT, ND, + BS Extremities- no clubbing, cyanosis, or edema MS- no significant deformity or atrophy Skin- no rash or lesion Psych- euthymic mood, full affect Neuro- strength and sensation are intact  EKG- Sinus brady at 46 bpm, pr int 172 ms, qrs int 108 ms, qtc 434 ms  Assessment and Plan:  1. Afib Maintaining SR on flecainide Continue 100 mg bid  Continue eliquis 5 mg bid  Continue metoprolol  12.5 mg bid   2. Bradycardia Asymptomatic  Return to afib clinic in 6 months, sooner if needed    Butch Penny C. Nai Dasch, [facility] [address] [place], [place] [address] [phone]

## 2016-05-05 ENCOUNTER — Other Ambulatory Visit: Payer: Self-pay | Admitting: Internal Medicine

## 2016-06-16 DIAGNOSIS — R8299 Other abnormal findings in urine: Secondary | ICD-10-CM | POA: Diagnosis not present

## 2016-06-16 DIAGNOSIS — Z125 Encounter for screening for malignant neoplasm of prostate: Secondary | ICD-10-CM | POA: Diagnosis not present

## 2016-06-16 DIAGNOSIS — E784 Other hyperlipidemia: Secondary | ICD-10-CM | POA: Diagnosis not present

## 2016-06-23 DIAGNOSIS — R972 Elevated prostate specific antigen [PSA]: Secondary | ICD-10-CM | POA: Diagnosis not present

## 2016-06-23 DIAGNOSIS — I480 Paroxysmal atrial fibrillation: Secondary | ICD-10-CM | POA: Diagnosis not present

## 2016-06-23 DIAGNOSIS — G4709 Other insomnia: Secondary | ICD-10-CM | POA: Diagnosis not present

## 2016-06-23 DIAGNOSIS — Z6821 Body mass index (BMI) 21.0-21.9, adult: Secondary | ICD-10-CM | POA: Diagnosis not present

## 2016-06-23 DIAGNOSIS — K219 Gastro-esophageal reflux disease without esophagitis: Secondary | ICD-10-CM | POA: Diagnosis not present

## 2016-06-23 DIAGNOSIS — R351 Nocturia: Secondary | ICD-10-CM | POA: Diagnosis not present

## 2016-06-23 DIAGNOSIS — Z1389 Encounter for screening for other disorder: Secondary | ICD-10-CM | POA: Diagnosis not present

## 2016-06-23 DIAGNOSIS — Z0000 Encounter for general adult medical examination without abnormal findings: Secondary | ICD-10-CM | POA: Diagnosis not present

## 2016-06-23 DIAGNOSIS — E784 Other hyperlipidemia: Secondary | ICD-10-CM | POA: Diagnosis not present

## 2016-07-13 DIAGNOSIS — L570 Actinic keratosis: Secondary | ICD-10-CM | POA: Diagnosis not present

## 2016-07-13 DIAGNOSIS — L821 Other seborrheic keratosis: Secondary | ICD-10-CM | POA: Diagnosis not present

## 2016-07-13 DIAGNOSIS — L812 Freckles: Secondary | ICD-10-CM | POA: Diagnosis not present

## 2016-07-13 DIAGNOSIS — D2272 Melanocytic nevi of left lower limb, including hip: Secondary | ICD-10-CM | POA: Diagnosis not present

## 2016-07-13 DIAGNOSIS — L738 Other specified follicular disorders: Secondary | ICD-10-CM | POA: Diagnosis not present

## 2016-07-13 DIAGNOSIS — Z85828 Personal history of other malignant neoplasm of skin: Secondary | ICD-10-CM | POA: Diagnosis not present

## 2016-07-13 DIAGNOSIS — D1801 Hemangioma of skin and subcutaneous tissue: Secondary | ICD-10-CM | POA: Diagnosis not present

## 2016-08-15 DIAGNOSIS — H353131 Nonexudative age-related macular degeneration, bilateral, early dry stage: Secondary | ICD-10-CM | POA: Diagnosis not present

## 2016-08-15 DIAGNOSIS — H35371 Puckering of macula, right eye: Secondary | ICD-10-CM | POA: Diagnosis not present

## 2016-08-15 DIAGNOSIS — H01004 Unspecified blepharitis left upper eyelid: Secondary | ICD-10-CM | POA: Diagnosis not present

## 2016-08-15 DIAGNOSIS — H04123 Dry eye syndrome of bilateral lacrimal glands: Secondary | ICD-10-CM | POA: Diagnosis not present

## 2016-08-15 DIAGNOSIS — H01001 Unspecified blepharitis right upper eyelid: Secondary | ICD-10-CM | POA: Diagnosis not present

## 2016-11-27 ENCOUNTER — Other Ambulatory Visit (HOSPITAL_COMMUNITY): Payer: Self-pay | Admitting: Nurse Practitioner

## 2016-11-30 DIAGNOSIS — L821 Other seborrheic keratosis: Secondary | ICD-10-CM | POA: Diagnosis not present

## 2016-11-30 DIAGNOSIS — L578 Other skin changes due to chronic exposure to nonionizing radiation: Secondary | ICD-10-CM | POA: Diagnosis not present

## 2016-11-30 DIAGNOSIS — Z85828 Personal history of other malignant neoplasm of skin: Secondary | ICD-10-CM | POA: Diagnosis not present

## 2016-12-04 DIAGNOSIS — H353122 Nonexudative age-related macular degeneration, left eye, intermediate dry stage: Secondary | ICD-10-CM | POA: Diagnosis not present

## 2016-12-04 DIAGNOSIS — H353112 Nonexudative age-related macular degeneration, right eye, intermediate dry stage: Secondary | ICD-10-CM | POA: Diagnosis not present

## 2016-12-04 DIAGNOSIS — H43392 Other vitreous opacities, left eye: Secondary | ICD-10-CM | POA: Diagnosis not present

## 2016-12-04 DIAGNOSIS — H35371 Puckering of macula, right eye: Secondary | ICD-10-CM | POA: Diagnosis not present

## 2016-12-25 ENCOUNTER — Other Ambulatory Visit (HOSPITAL_COMMUNITY): Payer: Self-pay | Admitting: Nurse Practitioner

## 2016-12-26 ENCOUNTER — Ambulatory Visit (HOSPITAL_COMMUNITY)
Admission: RE | Admit: 2016-12-26 | Discharge: 2016-12-26 | Disposition: A | Discharge status: Home / Self Care | Payer: Medicare Other | Source: Ambulatory Visit | Attending: Nurse Practitioner | Admitting: Nurse Practitioner

## 2016-12-26 ENCOUNTER — Encounter (HOSPITAL_COMMUNITY): Payer: Self-pay | Admitting: Nurse Practitioner

## 2016-12-26 VITALS — BP 120/72 | HR 61 | Ht 68.0 in | Wt 134.6 lb

## 2016-12-26 DIAGNOSIS — Z825 Family history of asthma and other chronic lower respiratory diseases: Secondary | ICD-10-CM | POA: Diagnosis not present

## 2016-12-26 DIAGNOSIS — I4891 Unspecified atrial fibrillation: Secondary | ICD-10-CM | POA: Diagnosis not present

## 2016-12-26 DIAGNOSIS — I480 Paroxysmal atrial fibrillation: Secondary | ICD-10-CM | POA: Diagnosis not present

## 2016-12-26 DIAGNOSIS — M549 Dorsalgia, unspecified: Secondary | ICD-10-CM | POA: Insufficient documentation

## 2016-12-26 DIAGNOSIS — E785 Hyperlipidemia, unspecified: Secondary | ICD-10-CM | POA: Diagnosis not present

## 2016-12-26 DIAGNOSIS — M25512 Pain in left shoulder: Secondary | ICD-10-CM | POA: Insufficient documentation

## 2016-12-26 DIAGNOSIS — G4700 Insomnia, unspecified: Secondary | ICD-10-CM | POA: Diagnosis not present

## 2016-12-26 DIAGNOSIS — Z7901 Long term (current) use of anticoagulants: Secondary | ICD-10-CM | POA: Insufficient documentation

## 2016-12-26 DIAGNOSIS — Z8042 Family history of malignant neoplasm of prostate: Secondary | ICD-10-CM | POA: Diagnosis not present

## 2016-12-26 DIAGNOSIS — K219 Gastro-esophageal reflux disease without esophagitis: Secondary | ICD-10-CM | POA: Insufficient documentation

## 2016-12-26 DIAGNOSIS — M25511 Pain in right shoulder: Secondary | ICD-10-CM | POA: Diagnosis not present

## 2016-12-26 DIAGNOSIS — Z79899 Other long term (current) drug therapy: Secondary | ICD-10-CM | POA: Insufficient documentation

## 2016-12-26 DIAGNOSIS — M1990 Unspecified osteoarthritis, unspecified site: Secondary | ICD-10-CM | POA: Insufficient documentation

## 2016-12-26 DIAGNOSIS — Z86711 Personal history of pulmonary embolism: Secondary | ICD-10-CM | POA: Diagnosis not present

## 2016-12-26 DIAGNOSIS — Z803 Family history of malignant neoplasm of breast: Secondary | ICD-10-CM | POA: Diagnosis not present

## 2016-12-26 NOTE — Progress Notes (Signed)
Patient ID: Robert Hart, male   DOB: 16-Dec-1950, 66 y.o.   MRN: [id number]     Primary Care Physician: Leanna Battles, MD Referring Physician: Dr. Nelly Rout is a 66 y.o. male with a h/o afib on flecainide 100 mg bid and maintaining SR without any breakthrough afib. On last visit, he wanted to try to reduce dose of flecainide to see if he can maintain rhythm on lower dose.He thought he might feel better on less flecainide. He was having memory issues and asked if it may be related to flecainide, which I do not commonly see or hear related to flecainide. No issues with blood thinner.  He returns 3/27 and reports that he did not try to reduce the dose of flecainide since everything was going so well. He still reports no breakthrough afib. He just finished a memory assessment thru a physician in [facility] last week and is waiting the results. He feels that he did ok.  Returns to the afib clinic 11/20. He continues on flecainide and low dose metoprolol. No bleeding issues with eliquis 5 mg bid. He has not noticed any heart irregularity. No lightheadedness with bradycardia. He feels well. He said no memory issues when he was evaluated by the physician in  [facility] earlier this year.  F/u in afib clinic, 8/7, he feels well. He has lost 3 lbs but this is 2/2 his wife being on a diet and eating a low carb diet. He has not had any issues with afib. Continue on flecainide and eliquis. He reports recent physical  labs with PCP normal.  Today, he denies symptoms of palpitations, chest pain, shortness of breath, orthopnea, PND, lower extremity edema, dizziness, presyncope, syncope, or neurologic sequela. The patient is tolerating medications without difficulties and is otherwise without complaint today.   Past Medical History:  Diagnosis Date  . Arthritis   . Back pain   . Dyspnea   . ED (erectile dysfunction)   . GERD (gastroesophageal reflux disease)   . Hemorrhoids   . Hyperlipidemia    . Insomnia   . Nocturia   . PE (pulmonary embolism)   . Rhinitis   . Shoulder pain, bilateral   . Urinary hesitancy    Past Surgical History:  Procedure Laterality Date  . CARDIOVERSION N/A 11/16/2014   Procedure: CARDIOVERSION;  Surgeon: Sanda Klein, MD;  [facility]: [facility];  Service: Cardiovascular;  Laterality: N/A;  . COLONOSCOPY    . TONSILLECTOMY AND ADENOIDECTOMY    . VASECTOMY  1989    Current Outpatient Prescriptions  Medication Sig Dispense Refill  . ELIQUIS 5 MG TABS tablet Take 1 tablet (5 mg total) by mouth 2 (two) times daily. 60 tablet 1  . flecainide (TAMBOCOR) 100 MG tablet TAKE 1 TABLET TWICE DAILY. 60 tablet 1  . metoprolol succinate (TOPROL-XL) 25 MG 24 hr tablet Take 0.5 tablets (12.5 mg total) by mouth daily. 15 tablet 11  . Multiple Vitamins-Minerals (MULTIVITAMIN & MINERAL PO) Take 1 tablet by mouth daily.    . pravastatin (PRAVACHOL) 40 MG tablet Take 1 tablet (40 mg total) by mouth daily.    . traMADol (ULTRAM) 50 MG tablet Take 50 mg by mouth every 6 (six) hours as needed for moderate pain or severe pain.   4  . traZODone (DESYREL) 100 MG tablet Take 100 mg by mouth at bedtime.   11   No current facility-administered medications for this encounter.     No Known Allergies  Social History   Social History  . Marital status: Married    Spouse name: N/A  . Number of children: N/A  . Years of education: N/A   Occupational History  . Not on file.   Social History Main Topics  . Smoking status: Never Smoker  . Smokeless tobacco: Never Used  . Alcohol use Not on file  . Drug use: Unknown  . Sexual activity: Not on file   Other Topics Concern  . Not on file   Social History Narrative  . No narrative on file    Family History  Problem Relation Age of Onset  . Breast cancer Mother   . Prostate cancer Father   . COPD Father   . Lung disease Father     ROS- All systems are reviewed and negative except as per the HPI  above  Physical Exam: Vitals:   12/26/16 0847  BP: 120/72  Pulse: 61  Weight: 134 lb 9.6 oz (61.1 kg)  Height: 5\' 8"  (1.727 m)    GEN- The patient is well appearing, alert and oriented x 3 today.   Head- normocephalic, atraumatic Eyes-  Sclera clear, conjunctiva pink Ears- hearing intact Oropharynx- clear Neck- supple, no JVP Lymph- no cervical lymphadenopathy Lungs- Clear to ausculation bilaterally, normal work of breathing Heart- Regular rate and rhythm, no murmurs, rubs or gallops, PMI not laterally displaced GI- soft, NT, ND, + BS Extremities- no clubbing, cyanosis, or edema MS- no significant deformity or atrophy Skin- no rash or lesion Psych- euthymic mood, full affect Neuro- strength and sensation are intact  EKG- Sinus brady at 61 bpm, pr int 162 ms, qrs int 102 ms, qtc 459 ms Epic records reviewed  Assessment and Plan:  1. Afib Maintaining SR on flecainide Continue 100 mg bid  Continue eliquis 5 mg bid  Continue metoprolol  12.5 mg bid   Return to afib clinic in 9 months, sooner if needed    Butch Penny C. Nillie Bartolotta, [facility] [address] [place], [place] [address] [phone]

## 2017-01-11 DIAGNOSIS — Z6820 Body mass index (BMI) 20.0-20.9, adult: Secondary | ICD-10-CM | POA: Diagnosis not present

## 2017-01-11 DIAGNOSIS — R05 Cough: Secondary | ICD-10-CM | POA: Diagnosis not present

## 2017-02-23 DIAGNOSIS — H01001 Unspecified blepharitis right upper eyelid: Secondary | ICD-10-CM | POA: Diagnosis not present

## 2017-02-23 DIAGNOSIS — H01005 Unspecified blepharitis left lower eyelid: Secondary | ICD-10-CM | POA: Diagnosis not present

## 2017-02-23 DIAGNOSIS — H01002 Unspecified blepharitis right lower eyelid: Secondary | ICD-10-CM | POA: Diagnosis not present

## 2017-02-23 DIAGNOSIS — H01004 Unspecified blepharitis left upper eyelid: Secondary | ICD-10-CM | POA: Diagnosis not present

## 2017-02-23 DIAGNOSIS — H00015 Hordeolum externum left lower eyelid: Secondary | ICD-10-CM | POA: Diagnosis not present

## 2017-02-26 ENCOUNTER — Other Ambulatory Visit (HOSPITAL_COMMUNITY): Payer: Self-pay | Admitting: Nurse Practitioner

## 2017-03-06 DIAGNOSIS — H01002 Unspecified blepharitis right lower eyelid: Secondary | ICD-10-CM | POA: Diagnosis not present

## 2017-03-06 DIAGNOSIS — H01001 Unspecified blepharitis right upper eyelid: Secondary | ICD-10-CM | POA: Diagnosis not present

## 2017-03-06 DIAGNOSIS — H01004 Unspecified blepharitis left upper eyelid: Secondary | ICD-10-CM | POA: Diagnosis not present

## 2017-03-06 DIAGNOSIS — H00015 Hordeolum externum left lower eyelid: Secondary | ICD-10-CM | POA: Diagnosis not present

## 2017-04-19 ENCOUNTER — Other Ambulatory Visit (HOSPITAL_COMMUNITY): Payer: Self-pay | Admitting: Nurse Practitioner

## 2017-06-04 DIAGNOSIS — D1801 Hemangioma of skin and subcutaneous tissue: Secondary | ICD-10-CM | POA: Diagnosis not present

## 2017-06-04 DIAGNOSIS — L821 Other seborrheic keratosis: Secondary | ICD-10-CM | POA: Diagnosis not present

## 2017-06-04 DIAGNOSIS — Z85828 Personal history of other malignant neoplasm of skin: Secondary | ICD-10-CM | POA: Diagnosis not present

## 2017-06-04 DIAGNOSIS — L570 Actinic keratosis: Secondary | ICD-10-CM | POA: Diagnosis not present

## 2017-06-04 DIAGNOSIS — L111 Transient acantholytic dermatosis [Grover]: Secondary | ICD-10-CM | POA: Diagnosis not present

## 2017-06-04 DIAGNOSIS — L578 Other skin changes due to chronic exposure to nonionizing radiation: Secondary | ICD-10-CM | POA: Diagnosis not present

## 2017-08-15 DIAGNOSIS — Z125 Encounter for screening for malignant neoplasm of prostate: Secondary | ICD-10-CM | POA: Diagnosis not present

## 2017-08-15 DIAGNOSIS — E7849 Other hyperlipidemia: Secondary | ICD-10-CM | POA: Diagnosis not present

## 2017-08-15 DIAGNOSIS — R82998 Other abnormal findings in urine: Secondary | ICD-10-CM | POA: Diagnosis not present

## 2017-08-21 DIAGNOSIS — E7849 Other hyperlipidemia: Secondary | ICD-10-CM | POA: Diagnosis not present

## 2017-08-21 DIAGNOSIS — Z0000 Encounter for general adult medical examination without abnormal findings: Secondary | ICD-10-CM | POA: Diagnosis not present

## 2017-08-21 DIAGNOSIS — I480 Paroxysmal atrial fibrillation: Secondary | ICD-10-CM | POA: Diagnosis not present

## 2017-08-21 DIAGNOSIS — R972 Elevated prostate specific antigen [PSA]: Secondary | ICD-10-CM | POA: Diagnosis not present

## 2017-08-21 DIAGNOSIS — H6121 Impacted cerumen, right ear: Secondary | ICD-10-CM | POA: Diagnosis not present

## 2017-08-21 DIAGNOSIS — Z1389 Encounter for screening for other disorder: Secondary | ICD-10-CM | POA: Diagnosis not present

## 2017-08-21 DIAGNOSIS — K5909 Other constipation: Secondary | ICD-10-CM | POA: Diagnosis not present

## 2017-08-21 DIAGNOSIS — R05 Cough: Secondary | ICD-10-CM | POA: Diagnosis not present

## 2017-08-21 DIAGNOSIS — G4709 Other insomnia: Secondary | ICD-10-CM | POA: Diagnosis not present

## 2017-08-21 DIAGNOSIS — Z6821 Body mass index (BMI) 21.0-21.9, adult: Secondary | ICD-10-CM | POA: Diagnosis not present

## 2017-08-28 ENCOUNTER — Encounter (HOSPITAL_COMMUNITY): Payer: Self-pay | Admitting: Nurse Practitioner

## 2017-08-28 ENCOUNTER — Ambulatory Visit (HOSPITAL_COMMUNITY)
Admission: RE | Admit: 2017-08-28 | Discharge: 2017-08-28 | Disposition: A | Discharge status: Home / Self Care | Payer: Medicare Other | Source: Ambulatory Visit | Attending: Nurse Practitioner | Admitting: Nurse Practitioner

## 2017-08-28 VITALS — BP 118/78 | HR 61 | Ht 68.0 in | Wt 138.8 lb

## 2017-08-28 DIAGNOSIS — Z79899 Other long term (current) drug therapy: Secondary | ICD-10-CM | POA: Diagnosis not present

## 2017-08-28 DIAGNOSIS — Z86711 Personal history of pulmonary embolism: Secondary | ICD-10-CM | POA: Insufficient documentation

## 2017-08-28 DIAGNOSIS — Z825 Family history of asthma and other chronic lower respiratory diseases: Secondary | ICD-10-CM | POA: Diagnosis not present

## 2017-08-28 DIAGNOSIS — Z7901 Long term (current) use of anticoagulants: Secondary | ICD-10-CM | POA: Diagnosis not present

## 2017-08-28 DIAGNOSIS — Z8042 Family history of malignant neoplasm of prostate: Secondary | ICD-10-CM | POA: Diagnosis not present

## 2017-08-28 DIAGNOSIS — R0600 Dyspnea, unspecified: Secondary | ICD-10-CM | POA: Insufficient documentation

## 2017-08-28 DIAGNOSIS — K219 Gastro-esophageal reflux disease without esophagitis: Secondary | ICD-10-CM | POA: Diagnosis not present

## 2017-08-28 DIAGNOSIS — Z9889 Other specified postprocedural states: Secondary | ICD-10-CM | POA: Diagnosis not present

## 2017-08-28 DIAGNOSIS — E785 Hyperlipidemia, unspecified: Secondary | ICD-10-CM | POA: Insufficient documentation

## 2017-08-28 DIAGNOSIS — Z803 Family history of malignant neoplasm of breast: Secondary | ICD-10-CM | POA: Insufficient documentation

## 2017-08-28 DIAGNOSIS — Z79891 Long term (current) use of opiate analgesic: Secondary | ICD-10-CM | POA: Diagnosis not present

## 2017-08-28 DIAGNOSIS — G4700 Insomnia, unspecified: Secondary | ICD-10-CM | POA: Diagnosis not present

## 2017-08-28 DIAGNOSIS — I4891 Unspecified atrial fibrillation: Secondary | ICD-10-CM | POA: Insufficient documentation

## 2017-08-28 DIAGNOSIS — I480 Paroxysmal atrial fibrillation: Secondary | ICD-10-CM

## 2017-08-28 MED ORDER — FLECAINIDE ACETATE 100 MG PO TABS: 100.0000 mg | ORAL_TABLET | Freq: Two times a day (BID) | ORAL | 6 refills | Status: DC

## 2017-08-28 MED ORDER — METOPROLOL SUCCINATE ER 25 MG PO TB24: ORAL_TABLET | ORAL | 2 refills | Status: DC

## 2017-08-28 NOTE — Addendum Note (Signed)
Encounter addended by: Sherran Needs, NP on: 08/28/2017 10:43 AM  Actions taken: Sign clinical note

## 2017-08-28 NOTE — Progress Notes (Addendum)
Patient ID: Robert Hart, male   DOB: [birth year], 67 y.o.   MRN: [id number]     Primary Care Physician: Leanna Battles, MD Referring Physician: Dr. Nelly Rout is a 67 y.o. male with a h/o afib on flecainide 100 mg bid and maintaining SR without any breakthrough afib. No bleeding issues on Eliquis. Feels he is dong very well, no heart problems to report.  Today, he denies symptoms of palpitations, chest pain, shortness of breath, orthopnea, PND, lower extremity edema, dizziness, presyncope, syncope, or neurologic sequela. The patient is tolerating medications without difficulties and is otherwise without complaint today.   Past Medical History:  Diagnosis Date  . Arthritis   . Back pain   . Dyspnea   . ED (erectile dysfunction)   . GERD (gastroesophageal reflux disease)   . Hemorrhoids   . Hyperlipidemia   . Insomnia   . Nocturia   . PE (pulmonary embolism)   . Rhinitis   . Shoulder pain, bilateral   . Urinary hesitancy    Past Surgical History:  Procedure Laterality Date  . CARDIOVERSION N/A 11/16/2014   Procedure: CARDIOVERSION;  Surgeon: Sanda Klein, MD;  Location: [facility];  Service: Cardiovascular;  Laterality: N/A;  . COLONOSCOPY    . TONSILLECTOMY AND ADENOIDECTOMY    . VASECTOMY  1989    Current Outpatient Medications  Medication Sig Dispense Refill  . ELIQUIS 5 MG TABS tablet Take 1 tablet (5 mg total) by mouth 2 (two) times daily. 60 tablet 1  . flecainide (TAMBOCOR) 100 MG tablet TAKE 1 TABLET TWICE DAILY. 60 tablet 6  . metoprolol succinate (TOPROL-XL) 25 MG 24 hr tablet TAKE (1/2) TABLET DAILY. 45 tablet 1  . Multiple Vitamins-Minerals (MULTIVITAMIN & MINERAL PO) Take 1 tablet by mouth daily.    . pravastatin (PRAVACHOL) 40 MG tablet Take 1 tablet (40 mg total) by mouth daily.    . traMADol (ULTRAM) 50 MG tablet Take 50 mg by mouth every 6 (six) hours as needed for moderate pain or severe pain.   4  . traZODone (DESYREL) 100 MG tablet Take  100 mg by mouth at bedtime.   11   No current facility-administered medications for this encounter.     No Known Allergies  Social History   Socioeconomic History  . Marital status: Married    Spouse name: Not on file  . Number of children: Not on file  . Years of education: Not on file  . Highest education level: Not on file  Occupational History  . Not on file  Social Needs  . Financial resource strain: Not on file  . Food insecurity:    Worry: Not on file    Inability: Not on file  . Transportation needs:    Medical: Not on file    Non-medical: Not on file  Tobacco Use  . Smoking status: Never Smoker  . Smokeless tobacco: Never Used  Substance and Sexual Activity  . Alcohol use: Not on file  . Drug use: Not on file  . Sexual activity: Not on file  Lifestyle  . Physical activity:    Days per week: Not on file    Minutes per session: Not on file  . Stress: Not on file  Relationships  . Social connections:    Talks on phone: Not on file    Gets together: Not on file    Attends religious service: Not on file    Active member of club or organization:  Not on file    Attends meetings of clubs or organizations: Not on file    Relationship status: Not on file  . Intimate partner violence:    Fear of current or ex partner: Not on file    Emotionally abused: Not on file    Physically abused: Not on file    Forced sexual activity: Not on file  Other Topics Concern  . Not on file  Social History Narrative  . Not on file    Family History  Problem Relation Age of Onset  . Breast cancer Mother   . Prostate cancer Father   . COPD Father   . Lung disease Father     ROS- All systems are reviewed and negative except as per the HPI above  Physical Exam: Vitals:   08/28/17 0846  BP: 118/78  Pulse: 61  Weight: 138 lb 12.8 oz (63 kg)  Height: 5\' 8"  (1.727 m)    GEN- The patient is well appearing, alert and oriented x 3 today.   Head- normocephalic,  atraumatic Eyes-  Sclera clear, conjunctiva pink Ears- hearing intact Oropharynx- clear Neck- supple, no JVP Lymph- no cervical lymphadenopathy Lungs- Clear to ausculation bilaterally, normal work of breathing Heart- Regular rate and rhythm, no murmurs, rubs or gallops, PMI not laterally displaced GI- soft, NT, ND, + BS Extremities- no clubbing, cyanosis, or edema MS- no significant deformity or atrophy Skin- no rash or lesion Psych- euthymic mood, full affect Neuro- strength and sensation are intact  EKG- NSR at 61 bpm, PR int 186 bpm, Qrs int 104 ms, qtc 443 ms Epic records reviewed Labs from PCP received and reviewed showing creatinine of 1.1, BUN of 18, otherwise normal Bmet, LDL at 97, HDL at 72    Assessment and Plan:  1. Afib Maintaining SR on flecainide Continue 100 mg bid  Continue eliquis 5 mg bid  Continue metoprolol succinate 12.5 mg qd  Return to afib clinic in 9 months, sooner if needed    Butch Penny C. Robecca Fulgham, [facility] [address] [place], [place] [address] [phone]

## 2017-09-13 DIAGNOSIS — H00015 Hordeolum externum left lower eyelid: Secondary | ICD-10-CM | POA: Diagnosis not present

## 2017-10-12 DIAGNOSIS — J069 Acute upper respiratory infection, unspecified: Secondary | ICD-10-CM | POA: Diagnosis not present

## 2017-10-12 DIAGNOSIS — Z6821 Body mass index (BMI) 21.0-21.9, adult: Secondary | ICD-10-CM | POA: Diagnosis not present

## 2017-10-18 DIAGNOSIS — H01001 Unspecified blepharitis right upper eyelid: Secondary | ICD-10-CM | POA: Diagnosis not present

## 2017-10-18 DIAGNOSIS — H01005 Unspecified blepharitis left lower eyelid: Secondary | ICD-10-CM | POA: Diagnosis not present

## 2017-10-18 DIAGNOSIS — H01002 Unspecified blepharitis right lower eyelid: Secondary | ICD-10-CM | POA: Diagnosis not present

## 2017-10-18 DIAGNOSIS — H01004 Unspecified blepharitis left upper eyelid: Secondary | ICD-10-CM | POA: Diagnosis not present

## 2017-10-18 DIAGNOSIS — H524 Presbyopia: Secondary | ICD-10-CM | POA: Diagnosis not present

## 2017-12-10 DIAGNOSIS — H43392 Other vitreous opacities, left eye: Secondary | ICD-10-CM | POA: Diagnosis not present

## 2017-12-10 DIAGNOSIS — H353122 Nonexudative age-related macular degeneration, left eye, intermediate dry stage: Secondary | ICD-10-CM | POA: Diagnosis not present

## 2017-12-10 DIAGNOSIS — H43812 Vitreous degeneration, left eye: Secondary | ICD-10-CM | POA: Diagnosis not present

## 2017-12-10 DIAGNOSIS — H353112 Nonexudative age-related macular degeneration, right eye, intermediate dry stage: Secondary | ICD-10-CM | POA: Diagnosis not present

## 2018-05-03 ENCOUNTER — Other Ambulatory Visit (HOSPITAL_COMMUNITY): Payer: Self-pay | Admitting: Nurse Practitioner

## 2018-05-06 ENCOUNTER — Other Ambulatory Visit (HOSPITAL_COMMUNITY): Payer: Self-pay | Admitting: *Deleted

## 2018-05-28 ENCOUNTER — Ambulatory Visit (HOSPITAL_COMMUNITY)
Admission: RE | Admit: 2018-05-28 | Discharge: 2018-05-28 | Disposition: A | Discharge status: Home / Self Care | Payer: Medicare Other | Source: Ambulatory Visit | Attending: Nurse Practitioner | Admitting: Nurse Practitioner

## 2018-05-28 ENCOUNTER — Encounter (HOSPITAL_COMMUNITY): Payer: Self-pay | Admitting: Nurse Practitioner

## 2018-05-28 VITALS — BP 118/72 | HR 54 | Ht 68.0 in | Wt 145.0 lb

## 2018-05-28 DIAGNOSIS — G4700 Insomnia, unspecified: Secondary | ICD-10-CM | POA: Diagnosis not present

## 2018-05-28 DIAGNOSIS — E785 Hyperlipidemia, unspecified: Secondary | ICD-10-CM | POA: Diagnosis not present

## 2018-05-28 DIAGNOSIS — I480 Paroxysmal atrial fibrillation: Secondary | ICD-10-CM | POA: Diagnosis not present

## 2018-05-28 DIAGNOSIS — N529 Male erectile dysfunction, unspecified: Secondary | ICD-10-CM | POA: Diagnosis not present

## 2018-05-28 DIAGNOSIS — Z9889 Other specified postprocedural states: Secondary | ICD-10-CM | POA: Insufficient documentation

## 2018-05-28 DIAGNOSIS — I4891 Unspecified atrial fibrillation: Secondary | ICD-10-CM | POA: Insufficient documentation

## 2018-05-28 DIAGNOSIS — Z79899 Other long term (current) drug therapy: Secondary | ICD-10-CM | POA: Insufficient documentation

## 2018-05-28 DIAGNOSIS — Z79891 Long term (current) use of opiate analgesic: Secondary | ICD-10-CM | POA: Diagnosis not present

## 2018-05-28 NOTE — Progress Notes (Signed)
Patient ID: DERION KREITER, male   DOB: Jan 05, 1951, 68 y.o.   MRN: [id number]     Primary Care Physician: Leanna Battles, MD Referring Physician: Dr. Nelly Rout is a 68 y.o. male with a h/o afib on flecainide 100 mg bid and maintaining SR without any breakthrough afib. No bleeding issues on Eliquis. Feels he is dong very well, no heart problems to report.  Today, he denies symptoms of palpitations, chest pain, shortness of breath, orthopnea, PND, lower extremity edema, dizziness, presyncope, syncope, or neurologic sequela. The patient is tolerating medications without difficulties and is otherwise without complaint today.   Past Medical History:  Diagnosis Date  . Arthritis   . Back pain   . Dyspnea   . ED (erectile dysfunction)   . GERD (gastroesophageal reflux disease)   . Hemorrhoids   . Hyperlipidemia   . Insomnia   . Nocturia   . PE (pulmonary embolism)   . Rhinitis   . Shoulder pain, bilateral   . Urinary hesitancy    Past Surgical History:  Procedure Laterality Date  . CARDIOVERSION N/A 11/16/2014   Procedure: CARDIOVERSION;  Surgeon: Sanda Klein, MD;  Location: [facility];  Service: Cardiovascular;  Laterality: N/A;  . COLONOSCOPY    . TONSILLECTOMY AND ADENOIDECTOMY    . VASECTOMY  1989    Current Outpatient Medications  Medication Sig Dispense Refill  . ELIQUIS 5 MG TABS tablet Take 1 tablet (5 mg total) by mouth 2 (two) times daily. 60 tablet 1  . flecainide (TAMBOCOR) 100 MG tablet TAKE 1 TABLET BY MOUTH TWICE DAILY. 60 tablet 0  . metoprolol succinate (TOPROL-XL) 25 MG 24 hr tablet TAKE (1/2) TABLET DAILY. 45 tablet 2  . Multiple Vitamins-Minerals (MULTIVITAMIN & MINERAL PO) Take 1 tablet by mouth daily.    . pravastatin (PRAVACHOL) 40 MG tablet Take 1 tablet (40 mg total) by mouth daily.    . traMADol (ULTRAM) 50 MG tablet Take 50 mg by mouth every 6 (six) hours as needed for moderate pain or severe pain.   4  . traZODone (DESYREL) 100 MG  tablet Take 100 mg by mouth at bedtime.   11   No current facility-administered medications for this encounter.     No Known Allergies  Social History   Socioeconomic History  . Marital status: Married    Spouse name: Not on file  . Number of children: Not on file  . Years of education: Not on file  . Highest education level: Not on file  Occupational History  . Not on file  Social Needs  . Financial resource strain: Not on file  . Food insecurity:    Worry: Not on file    Inability: Not on file  . Transportation needs:    Medical: Not on file    Non-medical: Not on file  Tobacco Use  . Smoking status: Never Smoker  . Smokeless tobacco: Never Used  Substance and Sexual Activity  . Alcohol use: Not on file  . Drug use: Not on file  . Sexual activity: Not on file  Lifestyle  . Physical activity:    Days per week: Not on file    Minutes per session: Not on file  . Stress: Not on file  Relationships  . Social connections:    Talks on phone: Not on file    Gets together: Not on file    Attends religious service: Not on file    Active member of club  or organization: Not on file    Attends meetings of clubs or organizations: Not on file    Relationship status: Not on file  . Intimate partner violence:    Fear of current or ex partner: Not on file    Emotionally abused: Not on file    Physically abused: Not on file    Forced sexual activity: Not on file  Other Topics Concern  . Not on file  Social History Narrative  . Not on file    Family History  Problem Relation Age of Onset  . Breast cancer Mother   . Prostate cancer Father   . COPD Father   . Lung disease Father     ROS- All systems are reviewed and negative except as per the HPI above  Physical Exam: Vitals:   05/28/18 0843  BP: 118/72  Pulse: (!) 54  Weight: 65.8 kg  Height: 5\' 8"  (1.727 m)    GEN- The patient is well appearing, alert and oriented x 3 today.   Head- normocephalic,  atraumatic Eyes-  Sclera clear, conjunctiva pink Ears- hearing intact Oropharynx- clear Neck- supple, no JVP Lymph- no cervical lymphadenopathy Lungs- Clear to ausculation bilaterally, normal work of breathing Heart- slow regular rate and rhythm, no murmurs, rubs or gallops, PMI not laterally displaced GI- soft, NT, ND, + BS Extremities- no clubbing, cyanosis, or edema MS- no significant deformity or atrophy Skin- no rash or lesion Psych- euthymic mood, full affect Neuro- strength and sensation are intact  EKG sinus brady at 54 bpm, pr int 174 sm, qrs int 106 ms, qtc 447 ms Epic records reviewed Labs from PCP within last year  Per pt and normal   Assessment and Plan:  1. Afib Stable  Maintaining SR on flecainide Continue 100 mg bid  Continue eliquis 5 mg bid  Continue metoprolol succinate 12.5 mg qd  Return to afib clinic in 9 months, sooner if needed    Butch Penny C. , [facility] [address] [place], [place] [address] [phone]

## 2018-06-05 ENCOUNTER — Other Ambulatory Visit (HOSPITAL_COMMUNITY): Payer: Self-pay | Admitting: Nurse Practitioner

## 2018-07-04 ENCOUNTER — Other Ambulatory Visit (HOSPITAL_COMMUNITY): Payer: Self-pay | Admitting: Nurse Practitioner

## 2018-07-25 DIAGNOSIS — H43392 Other vitreous opacities, left eye: Secondary | ICD-10-CM | POA: Diagnosis not present

## 2018-07-25 DIAGNOSIS — H353122 Nonexudative age-related macular degeneration, left eye, intermediate dry stage: Secondary | ICD-10-CM | POA: Diagnosis not present

## 2018-07-25 DIAGNOSIS — H353112 Nonexudative age-related macular degeneration, right eye, intermediate dry stage: Secondary | ICD-10-CM | POA: Diagnosis not present

## 2018-07-25 DIAGNOSIS — H33012 Retinal detachment with single break, left eye: Secondary | ICD-10-CM | POA: Diagnosis not present

## 2018-07-26 DIAGNOSIS — H33012 Retinal detachment with single break, left eye: Secondary | ICD-10-CM | POA: Diagnosis not present

## 2018-07-31 DIAGNOSIS — H33012 Retinal detachment with single break, left eye: Secondary | ICD-10-CM | POA: Diagnosis not present

## 2018-07-31 DIAGNOSIS — Z09 Encounter for follow-up examination after completed treatment for conditions other than malignant neoplasm: Secondary | ICD-10-CM | POA: Diagnosis not present

## 2018-08-09 DIAGNOSIS — Z09 Encounter for follow-up examination after completed treatment for conditions other than malignant neoplasm: Secondary | ICD-10-CM | POA: Diagnosis not present

## 2018-08-09 DIAGNOSIS — H33012 Retinal detachment with single break, left eye: Secondary | ICD-10-CM | POA: Diagnosis not present

## 2018-08-13 DIAGNOSIS — H353122 Nonexudative age-related macular degeneration, left eye, intermediate dry stage: Secondary | ICD-10-CM | POA: Diagnosis not present

## 2018-08-13 DIAGNOSIS — Z09 Encounter for follow-up examination after completed treatment for conditions other than malignant neoplasm: Secondary | ICD-10-CM | POA: Diagnosis not present

## 2018-08-20 DIAGNOSIS — E7849 Other hyperlipidemia: Secondary | ICD-10-CM | POA: Diagnosis not present

## 2018-08-20 DIAGNOSIS — Z125 Encounter for screening for malignant neoplasm of prostate: Secondary | ICD-10-CM | POA: Diagnosis not present

## 2018-08-21 DIAGNOSIS — E7849 Other hyperlipidemia: Secondary | ICD-10-CM | POA: Diagnosis not present

## 2018-08-27 DIAGNOSIS — Z1339 Encounter for screening examination for other mental health and behavioral disorders: Secondary | ICD-10-CM | POA: Diagnosis not present

## 2018-08-27 DIAGNOSIS — I480 Paroxysmal atrial fibrillation: Secondary | ICD-10-CM | POA: Diagnosis not present

## 2018-08-27 DIAGNOSIS — G4700 Insomnia, unspecified: Secondary | ICD-10-CM | POA: Diagnosis not present

## 2018-08-27 DIAGNOSIS — R972 Elevated prostate specific antigen [PSA]: Secondary | ICD-10-CM | POA: Diagnosis not present

## 2018-08-27 DIAGNOSIS — M545 Low back pain: Secondary | ICD-10-CM | POA: Diagnosis not present

## 2018-08-27 DIAGNOSIS — E785 Hyperlipidemia, unspecified: Secondary | ICD-10-CM | POA: Diagnosis not present

## 2018-08-27 DIAGNOSIS — Z1331 Encounter for screening for depression: Secondary | ICD-10-CM | POA: Diagnosis not present

## 2018-08-27 DIAGNOSIS — Z0000 Encounter for general adult medical examination without abnormal findings: Secondary | ICD-10-CM | POA: Diagnosis not present

## 2018-09-24 DIAGNOSIS — R972 Elevated prostate specific antigen [PSA]: Secondary | ICD-10-CM | POA: Diagnosis not present

## 2018-09-26 DIAGNOSIS — H348322 Tributary (branch) retinal vein occlusion, left eye, stable: Secondary | ICD-10-CM | POA: Diagnosis not present

## 2018-09-26 DIAGNOSIS — H33012 Retinal detachment with single break, left eye: Secondary | ICD-10-CM | POA: Diagnosis not present

## 2018-11-14 DIAGNOSIS — H348322 Tributary (branch) retinal vein occlusion, left eye, stable: Secondary | ICD-10-CM | POA: Diagnosis not present

## 2018-11-14 DIAGNOSIS — Z9889 Other specified postprocedural states: Secondary | ICD-10-CM | POA: Diagnosis not present

## 2018-11-14 DIAGNOSIS — H353122 Nonexudative age-related macular degeneration, left eye, intermediate dry stage: Secondary | ICD-10-CM | POA: Diagnosis not present

## 2018-11-14 DIAGNOSIS — H353112 Nonexudative age-related macular degeneration, right eye, intermediate dry stage: Secondary | ICD-10-CM | POA: Diagnosis not present

## 2019-03-19 ENCOUNTER — Ambulatory Visit (HOSPITAL_COMMUNITY): Payer: Medicare Other | Admitting: Nurse Practitioner

## 2019-03-25 ENCOUNTER — Encounter (HOSPITAL_COMMUNITY): Payer: Self-pay | Admitting: Nurse Practitioner

## 2019-03-25 ENCOUNTER — Other Ambulatory Visit: Payer: Self-pay

## 2019-03-25 ENCOUNTER — Ambulatory Visit (HOSPITAL_COMMUNITY)
Admission: RE | Admit: 2019-03-25 | Discharge: 2019-03-25 | Disposition: A | Discharge status: Home / Self Care | Payer: Medicare Other | Source: Ambulatory Visit | Attending: Nurse Practitioner | Admitting: Nurse Practitioner

## 2019-03-25 VITALS — BP 118/70 | HR 55 | Ht 68.0 in | Wt 149.8 lb

## 2019-03-25 DIAGNOSIS — M1990 Unspecified osteoarthritis, unspecified site: Secondary | ICD-10-CM | POA: Diagnosis not present

## 2019-03-25 DIAGNOSIS — I480 Paroxysmal atrial fibrillation: Secondary | ICD-10-CM

## 2019-03-25 DIAGNOSIS — Z8042 Family history of malignant neoplasm of prostate: Secondary | ICD-10-CM | POA: Insufficient documentation

## 2019-03-25 DIAGNOSIS — E785 Hyperlipidemia, unspecified: Secondary | ICD-10-CM | POA: Insufficient documentation

## 2019-03-25 DIAGNOSIS — Z86711 Personal history of pulmonary embolism: Secondary | ICD-10-CM | POA: Insufficient documentation

## 2019-03-25 DIAGNOSIS — Z79899 Other long term (current) drug therapy: Secondary | ICD-10-CM | POA: Insufficient documentation

## 2019-03-25 DIAGNOSIS — Z825 Family history of asthma and other chronic lower respiratory diseases: Secondary | ICD-10-CM | POA: Insufficient documentation

## 2019-03-25 DIAGNOSIS — Z803 Family history of malignant neoplasm of breast: Secondary | ICD-10-CM | POA: Diagnosis not present

## 2019-03-25 DIAGNOSIS — Z7901 Long term (current) use of anticoagulants: Secondary | ICD-10-CM | POA: Diagnosis not present

## 2019-03-25 DIAGNOSIS — I4891 Unspecified atrial fibrillation: Secondary | ICD-10-CM | POA: Diagnosis present

## 2019-03-25 NOTE — Progress Notes (Signed)
Patient ID: Robert Hart, male   DOB: 1950/06/02, 68 y.o.   MRN: [id number]     Primary Care Physician: Leanna Battles, MD Referring Physician: Dr. Nelly Rout is a 68 y.o. male with a h/o afib on flecainide 100 mg bid and maintaining SR without any breakthrough afib. No bleeding issues on Eliquis. Feels he is dong very well, no heart problems to report.  Today, he denies symptoms of palpitations, chest pain, shortness of breath, orthopnea, PND, lower extremity edema, dizziness, presyncope, syncope, or neurologic sequela. The patient is tolerating medications without difficulties and is otherwise without complaint today.   Past Medical History:  Diagnosis Date  . Arthritis   . Back pain   . Dyspnea   . ED (erectile dysfunction)   . GERD (gastroesophageal reflux disease)   . Hemorrhoids   . Hyperlipidemia   . Insomnia   . Nocturia   . PE (pulmonary embolism)   . Rhinitis   . Shoulder pain, bilateral   . Urinary hesitancy    Past Surgical History:  Procedure Laterality Date  . CARDIOVERSION N/A 11/16/2014   Procedure: CARDIOVERSION;  Surgeon: Sanda Klein, MD;  Location: [facility];  Service: Cardiovascular;  Laterality: N/A;  . COLONOSCOPY    . TONSILLECTOMY AND ADENOIDECTOMY    . VASECTOMY  1989    Current Outpatient Medications  Medication Sig Dispense Refill  . ELIQUIS 5 MG TABS tablet Take 1 tablet (5 mg total) by mouth 2 (two) times daily. 60 tablet 1  . flecainide (TAMBOCOR) 100 MG tablet TAKE 1 TABLET BY MOUTH TWICE DAILY. 60 tablet 9  . metoprolol succinate (TOPROL-XL) 25 MG 24 hr tablet TAKE (1/2) TABLET DAILY. 15 tablet 9  . Multiple Vitamins-Minerals (MULTIVITAMIN & MINERAL PO) Take 1 tablet by mouth daily.    . pravastatin (PRAVACHOL) 40 MG tablet Take 1 tablet (40 mg total) by mouth daily.    . traMADol (ULTRAM) 50 MG tablet Take 50 mg by mouth every 6 (six) hours as needed for moderate pain or severe pain.   4  . traZODone (DESYREL) 100 MG  tablet Take 100 mg by mouth at bedtime.   11   No current facility-administered medications for this encounter.     No Known Allergies  Social History   Socioeconomic History  . Marital status: Married    Spouse name: Not on file  . Number of children: Not on file  . Years of education: Not on file  . Highest education level: Not on file  Occupational History  . Not on file  Social Needs  . Financial resource strain: Not on file  . Food insecurity    Worry: Not on file    Inability: Not on file  . Transportation needs    Medical: Not on file    Non-medical: Not on file  Tobacco Use  . Smoking status: Never Smoker  . Smokeless tobacco: Never Used  Substance and Sexual Activity  . Alcohol use: Not on file  . Drug use: Not on file  . Sexual activity: Not on file  Lifestyle  . Physical activity    Days per week: Not on file    Minutes per session: Not on file  . Stress: Not on file  Relationships  . Social Herbalist on phone: Not on file    Gets together: Not on file    Attends religious service: Not on file    Active member of club  or organization: Not on file    Attends meetings of clubs or organizations: Not on file    Relationship status: Not on file  . Intimate partner violence    Fear of current or ex partner: Not on file    Emotionally abused: Not on file    Physically abused: Not on file    Forced sexual activity: Not on file  Other Topics Concern  . Not on file  Social History Narrative  . Not on file    Family History  Problem Relation Age of Onset  . Breast cancer Mother   . Prostate cancer Father   . COPD Father   . Lung disease Father     ROS- All systems are reviewed and negative except as per the HPI above  Physical Exam: Vitals:   03/25/19 0850  BP: 118/70  Pulse: (!) 55  Weight: 67.9 kg  Height: 5\' 8"  (1.727 m)    GEN- The patient is well appearing, alert and oriented x 3 today.   Head- normocephalic,  atraumatic Eyes-  Sclera clear, conjunctiva pink Ears- hearing intact Oropharynx- clear Neck- supple, no JVP Lymph- no cervical lymphadenopathy Lungs- Clear to ausculation bilaterally, normal work of breathing Heart- slow regular rate and rhythm, no murmurs, rubs or gallops, PMI not laterally displaced GI- soft, NT, ND, + BS Extremities- no clubbing, cyanosis, or edema MS- no significant deformity or atrophy Skin- no rash or lesion Psych- euthymic mood, full affect Neuro- strength and sensation are intact  EKG sinus brady at 55 bpm, pr int 162 sm, qrs int 114 ms, qtc 449 ms Epic records reviewed   Assessment and Plan:  1. Paroxysmal Afib Stable  Maintaining SR on flecainide Continue 100 mg bid  Continue eliquis 5 mg bid  Continue metoprolol succinate 12.5 mg qd  Return to afib clinic in 9 months, sooner if needed    Butch Penny C. Ronnald Shedden, [facility] [address] [place], [place] [address] [phone]

## 2019-04-24 DIAGNOSIS — H353112 Nonexudative age-related macular degeneration, right eye, intermediate dry stage: Secondary | ICD-10-CM | POA: Diagnosis not present

## 2019-04-24 DIAGNOSIS — H348322 Tributary (branch) retinal vein occlusion, left eye, stable: Secondary | ICD-10-CM | POA: Diagnosis not present

## 2019-04-24 DIAGNOSIS — H33012 Retinal detachment with single break, left eye: Secondary | ICD-10-CM | POA: Diagnosis not present

## 2019-04-24 DIAGNOSIS — H353122 Nonexudative age-related macular degeneration, left eye, intermediate dry stage: Secondary | ICD-10-CM | POA: Diagnosis not present

## 2019-05-05 DIAGNOSIS — Z20828 Contact with and (suspected) exposure to other viral communicable diseases: Secondary | ICD-10-CM | POA: Diagnosis not present

## 2019-05-05 DIAGNOSIS — U071 COVID-19: Secondary | ICD-10-CM | POA: Diagnosis not present

## 2019-05-07 ENCOUNTER — Other Ambulatory Visit (HOSPITAL_COMMUNITY): Payer: Self-pay | Admitting: Nurse Practitioner

## 2019-06-10 ENCOUNTER — Ambulatory Visit: Payer: Medicare Other | Attending: Internal Medicine

## 2019-06-10 DIAGNOSIS — Z23 Encounter for immunization: Secondary | ICD-10-CM | POA: Diagnosis not present

## 2019-06-10 NOTE — Progress Notes (Signed)
   Covid-19 Vaccination Clinic  Name:  RHONE WNOROWSKI    MRN: [id number] DOB: 1951/02/20  06/10/2019  Mr. Tschantz was observed post Covid-19 immunization for 30 minutes based on pre-vaccination screening without incidence. He was provided with Vaccine Information Sheet and instruction to access the V-Safe system.   Mr. Kegg was instructed to call 911 with any severe reactions post vaccine: Marland Kitchen Difficulty breathing  . Swelling of your face and throat  . A fast heartbeat  . A bad rash all over your body  . Dizziness and weakness    Immunizations Administered    Name Date Dose VIS Date Route   Pfizer COVID-19 Vaccine 06/10/2019  4:21 PM 0.3 mL 05/02/2019 Intramuscular   Manufacturer: [place]   Lot: [id number]   [place]: [id number]

## 2019-06-29 ENCOUNTER — Ambulatory Visit: Payer: Medicare Other | Attending: Internal Medicine

## 2019-06-29 DIAGNOSIS — Z23 Encounter for immunization: Secondary | ICD-10-CM | POA: Insufficient documentation

## 2019-06-29 NOTE — Progress Notes (Signed)
   Covid-19 Vaccination Clinic  Name:  Robert Hart    MRN: [id number] DOB: [birth year]  06/29/2019  Robert Hart was observed post Covid-19 immunization for 15 minutes without incidence. He was provided with Vaccine Information Sheet and instruction to access the V-Safe system.   Robert Hart was instructed to call 911 with any severe reactions post vaccine: Marland Kitchen Difficulty breathing  . Swelling of your face and throat  . A fast heartbeat  . A bad rash all over your body  . Dizziness and weakness    Immunizations Administered    Name Date Dose VIS Date Route   Pfizer COVID-19 Vaccine 06/29/2019 11:52 AM 0.3 mL 05/02/2019 Intramuscular   Manufacturer: [place]   Lot: [id number]   [place]: [id number]

## 2019-07-01 ENCOUNTER — Ambulatory Visit: Payer: Medicare Other

## 2019-10-30 ENCOUNTER — Ambulatory Visit (INDEPENDENT_AMBULATORY_CARE_PROVIDER_SITE_OTHER): Payer: Medicare Other | Admitting: Ophthalmology

## 2019-10-30 ENCOUNTER — Other Ambulatory Visit: Payer: Self-pay

## 2019-10-30 ENCOUNTER — Encounter (INDEPENDENT_AMBULATORY_CARE_PROVIDER_SITE_OTHER): Payer: Self-pay | Admitting: Ophthalmology

## 2019-10-30 DIAGNOSIS — Z8669 Personal history of other diseases of the nervous system and sense organs: Secondary | ICD-10-CM

## 2019-10-30 DIAGNOSIS — Z9889 Other specified postprocedural states: Secondary | ICD-10-CM | POA: Diagnosis not present

## 2019-10-30 DIAGNOSIS — H353132 Nonexudative age-related macular degeneration, bilateral, intermediate dry stage: Secondary | ICD-10-CM | POA: Insufficient documentation

## 2019-10-30 NOTE — Assessment & Plan Note (Signed)

## 2019-10-30 NOTE — Progress Notes (Signed)
10/30/2019     CHIEF COMPLAINT Patient presents for Retina Follow Up   HISTORY OF PRESENT ILLNESS: Robert Hart is a 69 y.o. male who presents to the clinic today for:   HPI    Retina Follow Up    Patient presents with  CRVO/BRVO.  In left eye.  Duration of 6 months.  Since onset it is stable.          Comments    6 month follow up - OCT OU Patient denies change in vision and overall has no complaints.        Last edited by Robert Hart on 10/30/2019  8:33 AM. (History)      Referring physician: Leanna Battles, MD [place],  [place] [address]  HISTORICAL INFORMATION:   Selected notes from the [facility]: No current outpatient medications on file. (Ophthalmic Drugs)   No current facility-administered medications for this visit. (Ophthalmic Drugs)   Current Outpatient Medications (Other)  Medication Sig  . ELIQUIS 5 MG TABS tablet Take 1 tablet (5 mg total) by mouth 2 (two) times daily.  . flecainide (TAMBOCOR) 100 MG tablet TAKE 1 TABLET BY MOUTH TWICE DAILY.  . metoprolol succinate (TOPROL-XL) 25 MG 24 hr tablet TAKE (1/2) TABLET DAILY.  . Multiple Vitamins-Minerals (MULTIVITAMIN & MINERAL PO) Take 1 tablet by mouth daily.  . pravastatin (PRAVACHOL) 40 MG tablet Take 1 tablet (40 mg total) by mouth daily.  . traMADol (ULTRAM) 50 MG tablet Take 50 mg by mouth every 6 (six) hours as needed for moderate pain or severe pain.   . traZODone (DESYREL) 100 MG tablet Take 100 mg by mouth at bedtime.    No current facility-administered medications for this visit. (Other)      REVIEW OF SYSTEMS:    ALLERGIES No Known Allergies  PAST MEDICAL HISTORY Past Medical History:  Diagnosis Date  . Arthritis   . Back pain   . Dyspnea   . ED (erectile dysfunction)   . GERD (gastroesophageal reflux disease)   . Hemorrhoids   . Hyperlipidemia   . Insomnia   . Nocturia   . PE (pulmonary embolism)   . Rhinitis     . Shoulder pain, bilateral   . Urinary hesitancy    Past Surgical History:  Procedure Laterality Date  . CARDIOVERSION N/A 11/16/2014   Procedure: CARDIOVERSION;  Surgeon: Robert Klein, MD;  Location: [facility];  Service: Cardiovascular;  Laterality: N/A;  . COLONOSCOPY    . TONSILLECTOMY AND ADENOIDECTOMY    . VASECTOMY  1989    FAMILY HISTORY Family History  Problem Relation Age of Onset  . Breast cancer Mother   . Prostate cancer Father   . COPD Father   . Lung disease Father     SOCIAL HISTORY Social History   Tobacco Use  . Smoking status: Never Smoker  . Smokeless tobacco: Never Used  Substance Use Topics  . Alcohol use: Not on file  . Drug use: Not on file         OPHTHALMIC EXAM: Base Eye Exam    Visual Acuity (Snellen - Linear)      Right Left   Dist cc 20/25-2 20/20       Tonometry (Tonopen, 8:37 AM)      Right Left   Pressure 20 18       Pupils      Pupils Dark Light Shape React APD  Right PERRL 4 3 Round Slow None   Left PERRL 4 3 Round Slow None       Visual Fields (Counting fingers)      Left Right    Full Full       Extraocular Movement      Right Left    Full Full       Neuro/Psych    Oriented x3: Yes   Mood/Affect: Normal       Dilation    Both eyes: 1.0% Mydriacyl, 2.5% Phenylephrine @ 8:37 AM        Slit Lamp and Fundus Exam    External Exam      Right Left   External Normal Normal       Slit Lamp Exam      Right Left   Lids/Lashes Normal Normal   Conjunctiva/Sclera White and quiet White and quiet   Cornea Clear Clear   Anterior Chamber Deep and quiet Deep and quiet   Iris Round and reactive Round and reactive   Lens Posterior chamber intraocular lens Posterior chamber intraocular lens   Vitreous Normal Normal          IMAGING AND PROCEDURES  Imaging and Procedures for 10/30/19  OCT, Retina - OU - Both Eyes       Right Eye Quality was good. Scan locations included subfoveal.   Left  Eye Quality was good. Scan locations included subfoveal.                 ASSESSMENT/PLAN:  No problem-specific Assessment & Plan notes found for this encounter.    No diagnosis found.  1.  2.  3.  Ophthalmic Meds Ordered this visit:  No orders of the defined types were placed in this encounter.      No follow-ups on file.  There are no Patient Instructions on file for this visit.   Explained the diagnoses, plan, and follow up with the patient and they expressed understanding.  Patient expressed understanding of the importance of proper follow up care.   Robert Hart M.D. Diseases & Surgery of the Retina and Vitreous Retina & Diabetic [place] 10/30/19     Abbreviations: M myopia (nearsighted); A astigmatism; H hyperopia (farsighted); P presbyopia; Mrx spectacle prescription;  CTL contact lenses; OD right eye; OS left eye; OU both eyes  XT exotropia; ET esotropia; PEK punctate epithelial keratitis; PEE punctate epithelial erosions; DES dry eye syndrome; MGD meibomian gland dysfunction; ATs artificial tears; PFAT's preservative free artificial tears; [place] nuclear sclerotic cataract; PSC posterior subcapsular cataract; ERM epi-retinal membrane; PVD posterior vitreous detachment; RD retinal detachment; DM diabetes mellitus; DR diabetic retinopathy; NPDR non-proliferative diabetic retinopathy; PDR proliferative diabetic retinopathy; CSME clinically significant macular edema; DME diabetic macular edema; dbh dot blot hemorrhages; CWS cotton wool spot; POAG primary open angle glaucoma; C/D cup-to-disc ratio; HVF humphrey visual field; GVF goldmann visual field; OCT optical coherence tomography; IOP intraocular pressure; BRVO Branch retinal vein occlusion; CRVO central retinal vein occlusion; CRAO central retinal artery occlusion; BRAO branch retinal artery occlusion; RT retinal tear; SB scleral buckle; PPV pars plana vitrectomy; VH Vitreous hemorrhage; PRP panretinal laser  photocoagulation; IVK intravitreal kenalog; VMT vitreomacular traction; MH Macular hole;  NVD neovascularization of the disc; NVE neovascularization elsewhere; AREDS age related eye disease study; ARMD age related macular degeneration; POAG primary open angle glaucoma; EBMD epithelial/anterior basement membrane dystrophy; ACIOL anterior chamber intraocular lens; IOL intraocular lens; PCIOL posterior chamber intraocular lens; Phaco/IOL phacoemulsification with intraocular lens placement;  [place] photorefractive keratectomy; LASIK laser assisted in situ keratomileusis; HTN hypertension; DM diabetes mellitus; COPD chronic obstructive pulmonary disease

## 2019-11-18 ENCOUNTER — Other Ambulatory Visit (INDEPENDENT_AMBULATORY_CARE_PROVIDER_SITE_OTHER): Payer: Self-pay | Admitting: Ophthalmology

## 2019-12-15 ENCOUNTER — Other Ambulatory Visit (HOSPITAL_COMMUNITY): Payer: Self-pay | Admitting: *Deleted

## 2019-12-15 MED ORDER — FLECAINIDE ACETATE 100 MG PO TABS: 100.0000 mg | ORAL_TABLET | Freq: Two times a day (BID) | ORAL | 1 refills | Status: DC

## 2020-01-08 ENCOUNTER — Other Ambulatory Visit (HOSPITAL_COMMUNITY): Payer: Self-pay | Admitting: Nurse Practitioner

## 2020-04-30 DIAGNOSIS — E785 Hyperlipidemia, unspecified: Secondary | ICD-10-CM | POA: Diagnosis not present

## 2020-04-30 DIAGNOSIS — Z125 Encounter for screening for malignant neoplasm of prostate: Secondary | ICD-10-CM | POA: Diagnosis not present

## 2020-05-03 ENCOUNTER — Encounter (INDEPENDENT_AMBULATORY_CARE_PROVIDER_SITE_OTHER): Payer: Self-pay | Admitting: Ophthalmology

## 2020-05-03 ENCOUNTER — Other Ambulatory Visit: Payer: Self-pay

## 2020-05-03 ENCOUNTER — Ambulatory Visit (INDEPENDENT_AMBULATORY_CARE_PROVIDER_SITE_OTHER): Payer: Medicare Other | Admitting: Ophthalmology

## 2020-05-03 DIAGNOSIS — H353132 Nonexudative age-related macular degeneration, bilateral, intermediate dry stage: Secondary | ICD-10-CM

## 2020-05-03 DIAGNOSIS — Z9889 Other specified postprocedural states: Secondary | ICD-10-CM

## 2020-05-03 DIAGNOSIS — Z8669 Personal history of other diseases of the nervous system and sense organs: Secondary | ICD-10-CM | POA: Diagnosis not present

## 2020-05-03 DIAGNOSIS — H35372 Puckering of macula, left eye: Secondary | ICD-10-CM | POA: Insufficient documentation

## 2020-05-03 NOTE — Progress Notes (Signed)
05/03/2020     CHIEF COMPLAINT Patient presents for Retina Follow Up   HISTORY OF PRESENT ILLNESS: Robert Hart is a 69 y.o. male who presents to the clinic today for:   HPI    Retina Follow Up    Patient presents with  Dry AMD.  In both eyes.  This started 6 months ago.  Severity is mild.  Duration of 6 months.          Comments    6 MO FU OU    Pt reports change in NVA, a new floater that came up a few months ago but doesn't notice now, no pain or pressure.        Last edited by Nichola Sizer D on 05/03/2020  8:34 AM. (History)      Referring physician: Leanna Battles, MD [place],  [place] [address]  HISTORICAL INFORMATION:   Selected notes from the MEDICAL RECORD NUMBER       CURRENT MEDICATIONS: Current Outpatient Medications (Ophthalmic Drugs)  Medication Sig  . ketorolac (ACULAR) 0.5 % ophthalmic solution INSTILL ONE DROP INTO THE RIGHT EYE FOUR TIMES A DAY (Patient taking differently: Place into the right eye 2 (two) times daily.)   No current facility-administered medications for this visit. (Ophthalmic Drugs)   Current Outpatient Medications (Other)  Medication Sig  . ELIQUIS 5 MG TABS tablet Take 1 tablet (5 mg total) by mouth 2 (two) times daily.  . flecainide (TAMBOCOR) 100 MG tablet Take 1 tablet (100 mg total) by mouth 2 (two) times daily.  . metoprolol succinate (TOPROL-XL) 25 MG 24 hr tablet TAKE 1/2 TABLET BY MOUTH DAILY  . Multiple Vitamins-Minerals (MULTIVITAMIN & MINERAL PO) Take 1 tablet by mouth daily.  . pravastatin (PRAVACHOL) 40 MG tablet Take 1 tablet (40 mg total) by mouth daily.  . traMADol (ULTRAM) 50 MG tablet Take 50 mg by mouth every 6 (six) hours as needed for moderate pain or severe pain.   . traZODone (DESYREL) 100 MG tablet Take 100 mg by mouth at bedtime.    No current facility-administered medications for this visit. (Other)      REVIEW OF SYSTEMS:    ALLERGIES No Known Allergies  PAST  MEDICAL HISTORY Past Medical History:  Diagnosis Date  . Arthritis   . Back pain   . Dyspnea   . ED (erectile dysfunction)   . GERD (gastroesophageal reflux disease)   . Hemorrhoids   . Hyperlipidemia   . Insomnia   . Nocturia   . PE (pulmonary embolism)   . Rhinitis   . Shoulder pain, bilateral   . Urinary hesitancy    Past Surgical History:  Procedure Laterality Date  . CARDIOVERSION N/A 11/16/2014   Procedure: CARDIOVERSION;  Surgeon: Sanda Klein, MD;  Location: [facility];  Service: Cardiovascular;  Laterality: N/A;  . COLONOSCOPY    . TONSILLECTOMY AND ADENOIDECTOMY    . VASECTOMY  1989    FAMILY HISTORY Family History  Problem Relation Age of Onset  . Breast cancer Mother   . Prostate cancer Father   . COPD Father   . Lung disease Father     SOCIAL HISTORY Social History   Tobacco Use  . Smoking status: Never Smoker  . Smokeless tobacco: Never Used         OPHTHALMIC EXAM: Base Eye Exam    Visual Acuity (ETDRS)      Right Left   Dist cc 20/30 +2 20/25 -1   Dist  ph cc NI    Correction: Glasses       Tonometry (Tonopen, 8:40 AM)      Right Left   Pressure 17 19       Pupils      Dark Light Shape React APD   Right 4 3 Round Slow None   Left 4 3 Round Slow None       Visual Fields (Counting fingers)      Left Right    Full Full       Extraocular Movement      Right Left    Full Full       Neuro/Psych    Oriented x3: Yes   Mood/Affect: Normal       Dilation    Both eyes: 1.0% Mydriacyl, 2.5% Phenylephrine @ 8:40 AM        Slit Lamp and Fundus Exam    External Exam      Right Left   External Normal Normal       Slit Lamp Exam      Right Left   Lids/Lashes Normal Normal   Conjunctiva/Sclera White and quiet White and quiet   Cornea Clear Clear   Anterior Chamber Deep and quiet Deep and quiet   Iris Round and reactive Round and reactive   Lens Posterior chamber intraocular lens Posterior chamber intraocular lens    Anterior Vitreous Normal Normal       Fundus Exam      Right Left   Posterior Vitreous Normal, vitrectomized Normal, vitrectomized   Disc Normal Normal   C/D Ratio 0.05 0.0   Macula Intermediate age related macular degeneration, Soft drusen, no hemorrhage, no macular thickening Intermediate age related macular degeneration, Soft drusen, no hemorrhage, no macular thickening   Vessels Normal Normal   Periphery Normal,, no retinal breaks.  Good scleral buckle Normal,, no new breaks          IMAGING AND PROCEDURES  Imaging and Procedures for 05/03/20  OCT, Retina - OU - Both Eyes       Right Eye Quality was good. Scan locations included subfoveal. Central Foveal Thickness: 421. Progression has been stable. Findings include abnormal foveal contour, retinal drusen , no IRF, no SRF.   Left Eye Scan locations included subfoveal. Central Foveal Thickness: 332. Progression has been stable. Findings include retinal drusen , no SRF, no IRF, epiretinal membrane.   Notes No signs of complications of ARMD OU, observe  OS, minor epiretinal membrane nasal to the fovea observe       Color Fundus Photography Optos - OU - Both Eyes       Right Eye Progression has been stable. Disc findings include normal observations. Macula : drusen, retinal pigment epithelium abnormalities. Vessels : normal observations.   Left Eye Progression has been stable. Disc findings include normal observations. Macula : retinal pigment epithelium abnormalities, drusen. Vessels : normal observations.   Notes History of scleral buckle OD, good cryopexy superotemporal, no new breaks no new tears retina detached.   History of retinal detachment OS, status post vitrectomy, retinal detachment.  No new findings                 ASSESSMENT/PLAN:  History of retinal detachment OD buckle 2015  History of vitrectomy OD - 2020  OS history of retinal detachment attached  Macular pucker, left eye The  nature of macular pucker (epiretinal membrane ERM) was discussed with the patient as well as threshold criteria for vitrectomy surgery.  I explained that in rare cases another surgery is needed to actually remove a second wrinkle should it regrow.  Most often, the epiretinal membrane and underlying wrinkled internal limiting membrane are removed with the first surgery, to accomplish the goals.   If the operative eye is [place] (natural lens still present), cataract surgery is often recommended prior to Vitrectomy. This will enable the retina surgeon to have the best view during surgery and the patient to obtain optimal results in the future. Treatment options were discussed.  OS minor nasal to the fovea will observe      ICD-10-CM   1. Intermediate stage nonexudative age-related macular degeneration of both eyes  H35.3132 OCT, Retina - OU - Both Eyes    Color Fundus Photography Optos - OU - Both Eyes  2. History of retinal detachment  Z86.69   3. History of vitrectomy  Z98.890   4. Macular pucker, left eye  H35.372     1.  2.  3.  Ophthalmic Meds Ordered this visit:  No orders of the defined types were placed in this encounter.      Return in about 1 year (around 05/03/2021) for DILATE OU, COLOR FP, OCT.  There are no Patient Instructions on file for this visit.   Explained the diagnoses, plan, and follow up with the patient and they expressed understanding.  Patient expressed understanding of the importance of proper follow up care.   Clent Demark Debra Colon M.D. Diseases & Surgery of the Retina and Vitreous Retina & Diabetic [place] 05/03/20     Abbreviations: M myopia (nearsighted); A astigmatism; H hyperopia (farsighted); P presbyopia; Mrx spectacle prescription;  CTL contact lenses; OD right eye; OS left eye; OU both eyes  XT exotropia; ET esotropia; PEK punctate epithelial keratitis; PEE punctate epithelial erosions; DES dry eye syndrome; MGD meibomian gland dysfunction; ATs  artificial tears; PFAT's preservative free artificial tears; [place] nuclear sclerotic cataract; PSC posterior subcapsular cataract; ERM epi-retinal membrane; PVD posterior vitreous detachment; RD retinal detachment; DM diabetes mellitus; DR diabetic retinopathy; NPDR non-proliferative diabetic retinopathy; PDR proliferative diabetic retinopathy; CSME clinically significant macular edema; DME diabetic macular edema; dbh dot blot hemorrhages; CWS cotton wool spot; POAG primary open angle glaucoma; C/D cup-to-disc ratio; HVF humphrey visual field; GVF goldmann visual field; OCT optical coherence tomography; IOP intraocular pressure; BRVO Branch retinal vein occlusion; CRVO central retinal vein occlusion; CRAO central retinal artery occlusion; BRAO branch retinal artery occlusion; RT retinal tear; SB scleral buckle; PPV pars plana vitrectomy; VH Vitreous hemorrhage; PRP panretinal laser photocoagulation; IVK intravitreal kenalog; VMT vitreomacular traction; MH Macular hole;  NVD neovascularization of the disc; NVE neovascularization elsewhere; AREDS age related eye disease study; ARMD age related macular degeneration; POAG primary open angle glaucoma; EBMD epithelial/anterior basement membrane dystrophy; ACIOL anterior chamber intraocular lens; IOL intraocular lens; PCIOL posterior chamber intraocular lens; Phaco/IOL phacoemulsification with intraocular lens placement; [place] photorefractive keratectomy; LASIK laser assisted in situ keratomileusis; HTN hypertension; DM diabetes mellitus; COPD chronic obstructive pulmonary disease

## 2020-05-03 NOTE — Assessment & Plan Note (Signed)
The nature of macular pucker (epiretinal membrane ERM) was discussed with the patient as well as threshold criteria for vitrectomy surgery. I explained that in rare cases another surgery is needed to actually remove a second wrinkle should it regrow.  Most often, the epiretinal membrane and underlying wrinkled internal limiting membrane are removed with the first surgery, to accomplish the goals.   If the operative eye is [place] (natural lens still present), cataract surgery is often recommended prior to Vitrectomy. This will enable the retina surgeon to have the best view during surgery and the patient to obtain optimal results in the future. Treatment options were discussed.  OS minor nasal to the fovea will observe

## 2020-05-03 NOTE — Assessment & Plan Note (Addendum)
OD - 2020  OS history of retinal detachment attached

## 2020-05-03 NOTE — Assessment & Plan Note (Signed)
OD buckle 2015

## 2020-05-06 DIAGNOSIS — K219 Gastro-esophageal reflux disease without esophagitis: Secondary | ICD-10-CM | POA: Diagnosis not present

## 2020-05-06 DIAGNOSIS — I480 Paroxysmal atrial fibrillation: Secondary | ICD-10-CM | POA: Diagnosis not present

## 2020-05-06 DIAGNOSIS — J309 Allergic rhinitis, unspecified: Secondary | ICD-10-CM | POA: Diagnosis not present

## 2020-05-06 DIAGNOSIS — Z7901 Long term (current) use of anticoagulants: Secondary | ICD-10-CM | POA: Diagnosis not present

## 2020-05-06 DIAGNOSIS — R82998 Other abnormal findings in urine: Secondary | ICD-10-CM | POA: Diagnosis not present

## 2020-05-06 DIAGNOSIS — G4700 Insomnia, unspecified: Secondary | ICD-10-CM | POA: Diagnosis not present

## 2020-05-06 DIAGNOSIS — Z0000 Encounter for general adult medical examination without abnormal findings: Secondary | ICD-10-CM | POA: Diagnosis not present

## 2020-05-06 DIAGNOSIS — E785 Hyperlipidemia, unspecified: Secondary | ICD-10-CM | POA: Diagnosis not present

## 2020-05-06 DIAGNOSIS — Z1331 Encounter for screening for depression: Secondary | ICD-10-CM | POA: Diagnosis not present

## 2020-05-07 ENCOUNTER — Other Ambulatory Visit (HOSPITAL_COMMUNITY): Payer: Self-pay | Admitting: Nurse Practitioner

## 2020-05-11 DIAGNOSIS — Z1212 Encounter for screening for malignant neoplasm of rectum: Secondary | ICD-10-CM | POA: Diagnosis not present

## 2020-06-02 DIAGNOSIS — L111 Transient acantholytic dermatosis [Grover]: Secondary | ICD-10-CM | POA: Diagnosis not present

## 2020-06-02 DIAGNOSIS — L820 Inflamed seborrheic keratosis: Secondary | ICD-10-CM | POA: Diagnosis not present

## 2020-06-02 DIAGNOSIS — L570 Actinic keratosis: Secondary | ICD-10-CM | POA: Diagnosis not present

## 2020-06-02 DIAGNOSIS — L812 Freckles: Secondary | ICD-10-CM | POA: Diagnosis not present

## 2020-06-02 DIAGNOSIS — L821 Other seborrheic keratosis: Secondary | ICD-10-CM | POA: Diagnosis not present

## 2020-06-02 DIAGNOSIS — Z85828 Personal history of other malignant neoplasm of skin: Secondary | ICD-10-CM | POA: Diagnosis not present

## 2020-06-06 ENCOUNTER — Other Ambulatory Visit (HOSPITAL_COMMUNITY): Payer: Self-pay | Admitting: Nurse Practitioner

## 2020-06-11 ENCOUNTER — Other Ambulatory Visit (INDEPENDENT_AMBULATORY_CARE_PROVIDER_SITE_OTHER): Payer: Self-pay | Admitting: Ophthalmology

## 2020-06-18 ENCOUNTER — Other Ambulatory Visit (INDEPENDENT_AMBULATORY_CARE_PROVIDER_SITE_OTHER): Payer: Self-pay | Admitting: Ophthalmology

## 2020-07-05 ENCOUNTER — Other Ambulatory Visit (HOSPITAL_COMMUNITY): Payer: Self-pay | Admitting: *Deleted

## 2020-07-05 MED ORDER — FLECAINIDE ACETATE 100 MG PO TABS: 100.0000 mg | ORAL_TABLET | Freq: Two times a day (BID) | ORAL | 0 refills | Status: DC

## 2020-07-14 ENCOUNTER — Ambulatory Visit (HOSPITAL_COMMUNITY)
Admission: RE | Admit: 2020-07-14 | Discharge: 2020-07-14 | Disposition: A | Discharge status: Home / Self Care | Payer: Medicare Other | Source: Ambulatory Visit | Attending: Nurse Practitioner | Admitting: Nurse Practitioner

## 2020-07-14 ENCOUNTER — Encounter (HOSPITAL_COMMUNITY): Payer: Self-pay | Admitting: Nurse Practitioner

## 2020-07-14 ENCOUNTER — Other Ambulatory Visit: Payer: Self-pay

## 2020-07-14 VITALS — BP 98/70 | HR 55 | Ht 68.0 in | Wt 148.2 lb

## 2020-07-14 DIAGNOSIS — Z79899 Other long term (current) drug therapy: Secondary | ICD-10-CM | POA: Insufficient documentation

## 2020-07-14 DIAGNOSIS — D6869 Other thrombophilia: Secondary | ICD-10-CM | POA: Diagnosis not present

## 2020-07-14 DIAGNOSIS — Z7901 Long term (current) use of anticoagulants: Secondary | ICD-10-CM | POA: Diagnosis not present

## 2020-07-14 DIAGNOSIS — I480 Paroxysmal atrial fibrillation: Secondary | ICD-10-CM | POA: Insufficient documentation

## 2020-07-14 LAB — BASIC METABOLIC PANEL
Anion gap: 10 (ref 5–15)
BUN: 18 mg/dL (ref 8–23)
CO2: 27 mmol/L (ref 22–32)
Calcium: 8.9 mg/dL (ref 8.9–10.3)
Chloride: 100 mmol/L (ref 98–111)
Creatinine, Ser: 1.13 mg/dL (ref 0.61–1.24)
GFR, Estimated: >60 mL/min (ref >60)
Glucose, Bld: 95 mg/dL (ref 70–99)
Potassium: 4.2 mmol/L (ref 3.5–5.1)
Sodium: 137 mmol/L (ref 135–145)

## 2020-07-14 LAB — CBC
HCT: 37.1 % — ABNORMAL LOW (ref 39.0–52.0)
Hemoglobin: 12.4 g/dL — ABNORMAL LOW (ref 13.0–17.0)
MCH: 30.7 pg (ref 26.0–34.0)
MCHC: 33.4 g/dL (ref 30.0–36.0)
MCV: 91.8 fL (ref 80.0–100.0)
Platelets: 197 10*3/uL (ref 150–400)
RBC: 4.04 MIL/uL — ABNORMAL LOW (ref 4.22–5.81)
RDW: 12.6 % (ref 11.5–15.5)
WBC: 6.4 10*3/uL (ref 4.0–10.5)
nRBC: 0 % (ref 0.0–0.2)

## 2020-07-14 MED ORDER — FLECAINIDE ACETATE 100 MG PO TABS: 100.0000 mg | ORAL_TABLET | Freq: Two times a day (BID) | ORAL | 9 refills | Status: DC

## 2020-07-14 MED ORDER — METOPROLOL SUCCINATE ER 25 MG PO TB24: ORAL_TABLET | ORAL | 2 refills | Status: DC

## 2020-07-14 MED ORDER — ELIQUIS 5 MG PO TABS: 5.0000 mg | ORAL_TABLET | Freq: Two times a day (BID) | ORAL | 9 refills | Status: DC

## 2020-07-14 NOTE — Progress Notes (Signed)
Patient ID: Robert Hart, male   DOB: 12-19-1950, 70 y.o.   MRN: [id number]     Primary Care Physician: Leanna Battles, MD Referring Physician: Dr. Nelly Rout is a 70 y.o. male with a h/o afib on flecainide 100 mg bid and maintaining SR without any breakthrough afib. No bleeding issues on Eliquis. Has a HR in the 50's but not symptomatic with this. On low dose BB to oppose flecainide.   Today, he denies symptoms of palpitations, chest pain, shortness of breath, orthopnea, PND, lower extremity edema, dizziness, presyncope, syncope, or neurologic sequela. The patient is tolerating medications without difficulties and is otherwise without complaint today.   Past Medical History:  Diagnosis Date   Arthritis    Back pain    Dyspnea    ED (erectile dysfunction)    GERD (gastroesophageal reflux disease)    Hemorrhoids    Hyperlipidemia    Insomnia    Nocturia    PE (pulmonary embolism)    Rhinitis    Shoulder pain, bilateral    Urinary hesitancy    Past Surgical History:  Procedure Laterality Date   CARDIOVERSION N/A 11/16/2014   Procedure: CARDIOVERSION;  Surgeon: Sanda Klein, MD;  Location: [facility];  Service: Cardiovascular;  Laterality: N/A;   COLONOSCOPY     TONSILLECTOMY AND ADENOIDECTOMY     VASECTOMY  1989    Current Outpatient Medications  Medication Sig Dispense Refill   ELIQUIS 5 MG TABS tablet Take 1 tablet (5 mg total) by mouth 2 (two) times daily. 60 tablet 1   flecainide (TAMBOCOR) 100 MG tablet Take 1 tablet (100 mg total) by mouth 2 (two) times daily. Keep appt pending 60 tablet 0   ketorolac (ACULAR) 0.5 % ophthalmic solution INSTILL 1 DROP INTO THE RIGHT EYE FOUR TIMES A DAY 5 mL 1   metoprolol succinate (TOPROL-XL) 25 MG 24 hr tablet TAKE 1/2 TABLET BY MOUTH DAILY 45 tablet 2   Multiple Vitamins-Minerals (MULTIVITAMIN & MINERAL PO) Take 1 tablet by mouth daily.     pravastatin (PRAVACHOL) 40 MG tablet Take 1 tablet  (40 mg total) by mouth daily.     traMADol (ULTRAM) 50 MG tablet Take 50 mg by mouth every 6 (six) hours as needed for moderate pain or severe pain.   4   traZODone (DESYREL) 100 MG tablet Take 100 mg by mouth at bedtime.   11   No current facility-administered medications for this encounter.    No Known Allergies  Social History   Socioeconomic History   Marital status: Married    Spouse name: Not on file   Number of children: Not on file   Years of education: Not on file   Highest education level: Not on file  Occupational History   Not on file  Tobacco Use   Smoking status: Never Smoker   Smokeless tobacco: Never Used  Substance and Sexual Activity   Alcohol use: Not on file   Drug use: Not on file   Sexual activity: Not on file  Other Topics Concern   Not on file  Social History Narrative   Not on file   Social Determinants of Health   Financial Resource Strain: Not on file  Food Insecurity: Not on file  Transportation Needs: Not on file  Physical Activity: Not on file  Stress: Not on file  Social Connections: Not on file  Intimate Partner Violence: Not on file    Family History  Problem Relation Age  of Onset   Breast cancer Mother    Prostate cancer Father    COPD Father    Lung disease Father     ROS- All systems are reviewed and negative except as per the HPI above  Physical Exam: There were no vitals filed for this visit.  GEN- The patient is well appearing, alert and oriented x 3 today.   Head- normocephalic, atraumatic Eyes-  Sclera clear, conjunctiva pink Ears- hearing intact Oropharynx- clear Neck- supple, no JVP Lymph- no cervical lymphadenopathy Lungs- Clear to ausculation bilaterally, normal work of breathing Heart- slow regular rate and rhythm, no murmurs, rubs or gallops, PMI not laterally displaced GI- soft, NT, ND, + BS Extremities- no clubbing, cyanosis, or edema MS- no significant deformity or atrophy Skin- no  rash or lesion Psych- euthymic mood, full affect Neuro- strength and sensation are intact  EKG-  Sinus brady at 55 bpm, pr int 186 ms, qrs int 108 ms, qtc 445 ms  Epic records reviewed   Assessment and Plan:  1. Paroxysmal Afib Stable  Maintaining SR on flecainide Continue 100 mg bid  Continue metoprolol succinate 12.5 mg qd Cbc/bmet today   2. CHA2DS2VASc score of 3 Continue eliquis 5 mg bid   Return to afib clinic in 9 months, sooner if needed   Robert Hart, [facility] [address] [place], [place] [address] [phone]

## 2020-09-01 DIAGNOSIS — H35371 Puckering of macula, right eye: Secondary | ICD-10-CM | POA: Diagnosis not present

## 2020-09-01 DIAGNOSIS — H353131 Nonexudative age-related macular degeneration, bilateral, early dry stage: Secondary | ICD-10-CM | POA: Diagnosis not present

## 2020-09-01 DIAGNOSIS — H26492 Other secondary cataract, left eye: Secondary | ICD-10-CM | POA: Diagnosis not present

## 2020-09-01 DIAGNOSIS — H0100A Unspecified blepharitis right eye, upper and lower eyelids: Secondary | ICD-10-CM | POA: Diagnosis not present

## 2020-09-01 DIAGNOSIS — H524 Presbyopia: Secondary | ICD-10-CM | POA: Diagnosis not present

## 2020-09-23 DIAGNOSIS — H903 Sensorineural hearing loss, bilateral: Secondary | ICD-10-CM | POA: Diagnosis not present

## 2020-09-23 DIAGNOSIS — H9311 Tinnitus, right ear: Secondary | ICD-10-CM | POA: Diagnosis not present

## 2020-10-12 DIAGNOSIS — H903 Sensorineural hearing loss, bilateral: Secondary | ICD-10-CM | POA: Diagnosis not present

## 2020-10-21 DIAGNOSIS — Z23 Encounter for immunization: Secondary | ICD-10-CM | POA: Diagnosis not present

## 2020-11-29 DIAGNOSIS — H903 Sensorineural hearing loss, bilateral: Secondary | ICD-10-CM | POA: Diagnosis not present

## 2020-12-02 ENCOUNTER — Other Ambulatory Visit: Payer: Self-pay | Admitting: Physician Assistant

## 2020-12-02 DIAGNOSIS — H903 Sensorineural hearing loss, bilateral: Secondary | ICD-10-CM

## 2020-12-13 DIAGNOSIS — H3562 Retinal hemorrhage, left eye: Secondary | ICD-10-CM | POA: Diagnosis not present

## 2020-12-13 DIAGNOSIS — H26492 Other secondary cataract, left eye: Secondary | ICD-10-CM | POA: Diagnosis not present

## 2020-12-13 DIAGNOSIS — H353131 Nonexudative age-related macular degeneration, bilateral, early dry stage: Secondary | ICD-10-CM | POA: Diagnosis not present

## 2020-12-13 DIAGNOSIS — H31093 Other chorioretinal scars, bilateral: Secondary | ICD-10-CM | POA: Diagnosis not present

## 2020-12-15 ENCOUNTER — Ambulatory Visit
Admission: RE | Admit: 2020-12-15 | Discharge: 2020-12-15 | Disposition: A | Discharge status: Home / Self Care | Payer: Medicare Other | Source: Ambulatory Visit | Attending: Physician Assistant | Admitting: Physician Assistant

## 2020-12-15 ENCOUNTER — Other Ambulatory Visit: Payer: Self-pay

## 2020-12-15 DIAGNOSIS — J320 Chronic maxillary sinusitis: Secondary | ICD-10-CM | POA: Diagnosis not present

## 2020-12-15 DIAGNOSIS — H9191 Unspecified hearing loss, right ear: Secondary | ICD-10-CM | POA: Diagnosis not present

## 2020-12-15 DIAGNOSIS — H903 Sensorineural hearing loss, bilateral: Secondary | ICD-10-CM

## 2020-12-15 DIAGNOSIS — G9389 Other specified disorders of brain: Secondary | ICD-10-CM | POA: Diagnosis not present

## 2020-12-15 DIAGNOSIS — J321 Chronic frontal sinusitis: Secondary | ICD-10-CM | POA: Diagnosis not present

## 2020-12-15 MED ORDER — GADOBENATE DIMEGLUMINE 529 MG/ML IV SOLN
12.0000 mL | Freq: Once | INTRAVENOUS | Status: AC | PRN
  Administered 2020-12-15: 12 mL via INTRAVENOUS

## 2021-01-05 DIAGNOSIS — H903 Sensorineural hearing loss, bilateral: Secondary | ICD-10-CM | POA: Diagnosis not present

## 2021-01-06 DIAGNOSIS — H9121 Sudden idiopathic hearing loss, right ear: Secondary | ICD-10-CM | POA: Diagnosis not present

## 2021-01-10 DIAGNOSIS — H9121 Sudden idiopathic hearing loss, right ear: Secondary | ICD-10-CM | POA: Diagnosis not present

## 2021-01-14 DIAGNOSIS — H9121 Sudden idiopathic hearing loss, right ear: Secondary | ICD-10-CM | POA: Diagnosis not present

## 2021-01-16 ENCOUNTER — Other Ambulatory Visit (INDEPENDENT_AMBULATORY_CARE_PROVIDER_SITE_OTHER): Payer: Self-pay | Admitting: Ophthalmology

## 2021-01-18 DIAGNOSIS — H9121 Sudden idiopathic hearing loss, right ear: Secondary | ICD-10-CM | POA: Diagnosis not present

## 2021-02-02 DIAGNOSIS — J3489 Other specified disorders of nose and nasal sinuses: Secondary | ICD-10-CM | POA: Diagnosis not present

## 2021-02-02 DIAGNOSIS — J320 Chronic maxillary sinusitis: Secondary | ICD-10-CM | POA: Diagnosis not present

## 2021-02-02 DIAGNOSIS — J324 Chronic pansinusitis: Secondary | ICD-10-CM | POA: Diagnosis not present

## 2021-02-02 DIAGNOSIS — Z9889 Other specified postprocedural states: Secondary | ICD-10-CM | POA: Diagnosis not present

## 2021-02-02 DIAGNOSIS — H9121 Sudden idiopathic hearing loss, right ear: Secondary | ICD-10-CM | POA: Diagnosis not present

## 2021-02-02 DIAGNOSIS — J322 Chronic ethmoidal sinusitis: Secondary | ICD-10-CM | POA: Diagnosis not present

## 2021-02-02 DIAGNOSIS — J321 Chronic frontal sinusitis: Secondary | ICD-10-CM | POA: Diagnosis not present

## 2021-02-02 DIAGNOSIS — H903 Sensorineural hearing loss, bilateral: Secondary | ICD-10-CM | POA: Diagnosis not present

## 2021-02-02 DIAGNOSIS — R42 Dizziness and giddiness: Secondary | ICD-10-CM | POA: Diagnosis not present

## 2021-02-08 ENCOUNTER — Encounter: Payer: Medicare Other | Admitting: Physical Therapy

## 2021-02-15 ENCOUNTER — Other Ambulatory Visit: Payer: Self-pay

## 2021-02-15 ENCOUNTER — Ambulatory Visit: Payer: Medicare Other | Attending: Otolaryngology

## 2021-02-15 DIAGNOSIS — R2681 Unsteadiness on feet: Secondary | ICD-10-CM | POA: Insufficient documentation

## 2021-02-15 DIAGNOSIS — R42 Dizziness and giddiness: Secondary | ICD-10-CM | POA: Diagnosis not present

## 2021-02-15 DIAGNOSIS — R2689 Other abnormalities of gait and mobility: Secondary | ICD-10-CM | POA: Diagnosis not present

## 2021-02-15 DIAGNOSIS — M6281 Muscle weakness (generalized): Secondary | ICD-10-CM | POA: Diagnosis not present

## 2021-02-15 NOTE — Therapy (Signed)
[facility] [address] [place], [place], [address] Phone: [phone]   Fax:  [phone]  Physical Therapy Evaluation  Patient Details  Name: Robert Hart MRN: [id number] Date of Birth: August 26, 1950 Referring Provider (PT): Melida Quitter, MD   Encounter Date: 02/15/2021   PT End of Session - 02/15/21 1613     Visit Number 1    Number of Visits 13    Date for PT Re-Evaluation 04/01/21    Authorization Type Medicare + BCBS    Progress Note Due on Visit 10    PT Start Time 1530    PT Stop Time 1615    PT Time Calculation (min) 45 min    Equipment Utilized During Treatment Gait belt    Activity Tolerance Patient tolerated treatment well    Behavior During Therapy WFL for tasks assessed/performed             Past Medical History:  Diagnosis Date   Arthritis    Back pain    Dyspnea    ED (erectile dysfunction)    GERD (gastroesophageal reflux disease)    Hemorrhoids    Hyperlipidemia    Insomnia    Nocturia    PE (pulmonary embolism)    Rhinitis    Shoulder pain, bilateral    Urinary hesitancy     Past Surgical History:  Procedure Laterality Date   CARDIOVERSION N/A 11/16/2014   Procedure: CARDIOVERSION;  Surgeon: Sanda Klein, MD;  Location: [facility];  Service: Cardiovascular;  Laterality: N/A;   COLONOSCOPY     TONSILLECTOMY AND ADENOIDECTOMY     VASECTOMY  1989    There were no vitals filed for this visit.    Subjective Assessment - 02/15/21 1533     Subjective Has experienced a sudden hearing loss in the R ear, started approx April. Patient reports taking Meclizine inconsitently, but does not seem to be helping. Reports that he constantly feels off balance, reports this is the worst thing. Patient reports ENT states unsure of cause. Reports he does have a history of chronic sinus infections. Patient reports that he experiences. Denies vision changes. Tinnitus reported in the R ear.     Patient is accompained by: Family member   Robert Hart   Pertinent History Arthritis, Dyspnea, GERD, HLD, History of PE    Limitations Walking;Standing    Patient Stated Goals Get Rid of the Vertigo    Currently in Pain? No/denies                [facility] PT Assessment - 02/15/21 0001       Assessment   Medical Diagnosis Vertigo    Referring Provider (PT) Melida Quitter, MD    Onset Date/Surgical Date 02/07/21   referral date   Prior Therapy No PT Prior for Vertigo      Precautions   Precautions Other (comment)    Precaution Comments Arthritis, Dyspnea, GERD, HLD, History of PE, A-Fib      Restrictions   Weight Bearing Restrictions No      Balance Screen   Has the patient fallen in the past 6 months No    Has the patient had a decrease in activity level because of a fear of falling?  Yes    Is the patient reluctant to leave their home because of a fear of falling?  Yes      [place] residence    Living Arrangements Spouse/significant other  Available Help at Discharge Family    Type of [place] to enter    Entrance Stairs-Number of Steps 3    Entrance Stairs-Rails [place] Two level      Prior Function   Level of [place]   has stopped driving approx mid - august   Vocation Full time employment    Transport planner; Office Work      Cognition   Overall Cognitive Status Within Functional Limits for tasks assessed      Observation/Other Assessments   Focus on Therapeutic Outcomes (FOTO)  DPS: 60.7 DFS: 44      Sensation   Light Touch Appears Intact      ROM / Strength   AROM / PROM / Strength Strength      Strength   Overall Strength Deficits   reports gradual decreased endurance/strength due to increased inactivity. no formal measurements     Ambulation/Gait   Ambulation/Gait Yes    Ambulation/Gait Assistance 5: Supervision    Ambulation/Gait  Assistance Details into/out of therapy gym, mild unsteadiness    Assistive device None    Gait Pattern Wide base of support    Ambulation Surface Level;Indoor                    Vestibular Assessment - 02/15/21 0001       Symptom Behavior   Subjective history of current problem see subjective    Type of Dizziness  Imbalance;Unsteady with head/body turns;Vertigo    Frequency of Dizziness daily    Duration of Dizziness constant    Symptom Nature Motion provoked;Constant    Aggravating Factors Activity in general;Turning head quickly;Turning body quickly;Sit to stand;Walking in a crowd    Relieving Factors Lying supine;Rest;Slow movements    Progression of Symptoms Worse      Oculomotor Exam   Oculomotor Alignment Normal    Ocular ROM WNL    Spontaneous Absent    Gaze-induced  Absent    Smooth Pursuits Intact    Saccades Intact      Oculomotor Exam-Fixation Suppressed    Left Head Impulse Negative    Right Head Impulse Positive   Mild Dizziness     Vestibulo-Ocular Reflex   VOR 1 Head Only (x 1 viewing) Difficulty Maintaining Focus    VOR Cancellation Normal      Other Tests   Comments Completed M-CTSIB: situation 1: 30 seconds, situation 2: 20 seconds, situation 3: 30 seconds, situation 3: unable to complete, significant imbalance      Positional Sensitivities   Nose to Right Knee No dizziness    Right Knee to Sitting Mild dizziness    Nose to Left Knee No dizziness    Left Knee to Sitting Mild dizziness    Head Turning x 5 No dizziness    Head Nodding x 5 Mild dizziness    Positional Sensitivities Comments mild dizziness                Objective measurements completed on examination: See above findings.       PT Education - 02/15/21 1537     Education Details Educated on [employer]) Educated Spouse;Patient    Methods Explanation    Comprehension Verbalized understanding              PT Short Term Goals -  02/15/21 1711       PT SHORT  TERM GOAL #1   Title Patient will be independent with initial vestibular/balance HEP (ALL STGS Due: 03/11/21)    Baseline no HEP established    Time 3    Period Weeks    Status New    Target Date 03/11/21      PT SHORT TERM GOAL #2   Title Patient will improve situation 2 of M-CTSIB to >/= 30 seconds to demo improved balance    Baseline 20 seconds    Time 3    Period Weeks    Status New      PT SHORT TERM GOAL #3   Title FGA TBA and LTG to be set    Baseline TBA    Time 3    Period Weeks    Status New      PT SHORT TERM GOAL #4   Title DVA TBA and LTG to be set    Baseline TBA    Time 3    Period Weeks    Status New               PT Long Term Goals - 02/15/21 1714       PT LONG TERM GOAL #1   Title Patient will be independent with final vestibular/balance HEP (All LTGS Due: 04/01/21)    Baseline no HEP established    Time 6    Period Weeks    Status New    Target Date 04/01/21      PT LONG TERM GOAL #2   Title LTG to be set for FGA    Baseline TBA    Time 6    Period Weeks    Status New      PT LONG TERM GOAL #3   Title Patient will improve situation 4 of M-CTSIB to >/= 15 seconds to demo improved vestibular input for balance    Baseline <5 seconds    Time 6    Period Weeks    Status New      PT LONG TERM GOAL #4   Title LTG to be set for DVA    Baseline TBA    Time 6    Period Weeks    Status New      PT LONG TERM GOAL #5   Title Patient will improve DFS >/= 57%    Baseline DPS: 60.7 DFS: 44    Time 6    Period Weeks    Status New                    Plan - 02/15/21 1719     Clinical Impression Statement Patient is a 70 y.o male referred to Neuro OPPT for Dizziness. Patient's PMH significant for the following: Arthritis, Dyspnea, GERD, HLD, History of PE, A-Fib. Patient presents with the following impairments upon evaluation: dizziness, imbalance, decreased strength and endurance, abnormal gait,  and increased motion senstivity. Patient demonstrating Positive R HIT and associated dizziness. Demo decreased vestibular input with balance on M-CTSIB. Patient will benefit from skilled PT services to address deficits/impairments and improve tolerance for functional mobility.    Personal Factors and Comorbidities Comorbidity 3+;Age    Comorbidities Arthritis, Dyspnea, GERD, HLD, History of PE, A-Fib    Examination-Activity Limitations Locomotion Level;Transfers;Bend;Stairs    Examination-Participation Restrictions Community Activity;Occupation;Yard Work;Driving    Stability/Clinical Decision Making Stable/Uncomplicated    Clinical Decision Making Low    Rehab Potential Good    PT Frequency 2x / week    PT Duration  6 weeks    PT Treatment/Interventions ADLs/Self Care Home Management;Aquatic Therapy;Canalith Repostioning;Electrical Stimulation;Moist Heat;Cryotherapy;DME Instruction;Gait training;Stair training;Functional mobility training;Therapeutic activities;Therapeutic exercise;Balance training;Neuromuscular re-education;Patient/family education;Manual techniques;Vestibular;Dry needling    PT Next Visit Plan Assess DVA/FGA, Update Goals. Initiate HEP focused on VOR. SLOW Progression.    Consulted and Agree with Plan of Care Patient;Family member/caregiver    Family Member Consulted Wife Helene Kelp)             Patient will benefit from skilled therapeutic intervention in order to improve the following deficits and impairments:  Abnormal gait, Difficulty walking, Dizziness, Decreased activity tolerance, Decreased endurance, Decreased balance, Decreased strength  Visit Diagnosis: Dizziness and giddiness  Unsteadiness on feet  Muscle weakness (generalized)  Other abnormalities of gait and mobility     Problem List Patient Active Problem List   Diagnosis Date Noted   History of retinal detachment 05/03/2020   History of vitrectomy 05/03/2020   Macular pucker, left eye 05/03/2020    Intermediate stage nonexudative age-related macular degeneration of both eyes 10/30/2019   Longstanding persistent atrial fibrillation ([place])    Paroxysmal atrial fibrillation ([place]) 10/12/2014    Robert Hart, PT, DPT 02/15/2021, 5:27 PM  [facility] [address] [place], [place], [address] Phone: [phone]   Fax:  [phone]  Name: Robert Hart MRN: [id number] Date of Birth: 11-06-1950

## 2021-02-21 ENCOUNTER — Other Ambulatory Visit: Payer: Self-pay | Admitting: Otolaryngology

## 2021-02-22 DIAGNOSIS — I480 Paroxysmal atrial fibrillation: Secondary | ICD-10-CM | POA: Diagnosis not present

## 2021-02-22 DIAGNOSIS — J309 Allergic rhinitis, unspecified: Secondary | ICD-10-CM | POA: Diagnosis not present

## 2021-02-22 DIAGNOSIS — H90A21 Sensorineural hearing loss, unilateral, right ear, with restricted hearing on the contralateral side: Secondary | ICD-10-CM | POA: Diagnosis not present

## 2021-02-22 DIAGNOSIS — G4700 Insomnia, unspecified: Secondary | ICD-10-CM | POA: Diagnosis not present

## 2021-02-22 DIAGNOSIS — L989 Disorder of the skin and subcutaneous tissue, unspecified: Secondary | ICD-10-CM | POA: Diagnosis not present

## 2021-02-22 DIAGNOSIS — Z7901 Long term (current) use of anticoagulants: Secondary | ICD-10-CM | POA: Diagnosis not present

## 2021-02-22 DIAGNOSIS — K219 Gastro-esophageal reflux disease without esophagitis: Secondary | ICD-10-CM | POA: Diagnosis not present

## 2021-02-24 ENCOUNTER — Ambulatory Visit: Payer: Medicare Other | Attending: Otolaryngology | Admitting: Physical Therapy

## 2021-02-24 ENCOUNTER — Other Ambulatory Visit: Payer: Self-pay

## 2021-02-24 ENCOUNTER — Encounter: Payer: Self-pay | Admitting: Physical Therapy

## 2021-02-24 DIAGNOSIS — M6281 Muscle weakness (generalized): Secondary | ICD-10-CM | POA: Insufficient documentation

## 2021-02-24 DIAGNOSIS — R2689 Other abnormalities of gait and mobility: Secondary | ICD-10-CM | POA: Diagnosis not present

## 2021-02-24 DIAGNOSIS — R2681 Unsteadiness on feet: Secondary | ICD-10-CM | POA: Insufficient documentation

## 2021-02-24 DIAGNOSIS — R42 Dizziness and giddiness: Secondary | ICD-10-CM | POA: Diagnosis not present

## 2021-02-24 NOTE — Therapy (Addendum)
[facility] [address] [place], [place], [address] Phone: [phone]   Fax:  [phone]  Physical Therapy Treatment  Patient Details  Name: Robert Hart MRN: [id number] Date of Birth: [birth year] Referring Provider (PT): Melida Quitter, MD   Encounter Date: 02/24/2021   PT End of Session - 02/24/21 0909     Visit Number 2    Number of Visits 13    Date for PT Re-Evaluation 04/01/21    Authorization Type Medicare + BCBS    Progress Note Due on Visit 10    PT Start Time 0801    PT Stop Time 0848    PT Time Calculation (min) 47 min    Activity Tolerance Patient tolerated treatment well    Behavior During Therapy [facility] for tasks assessed/performed             Past Medical History:  Diagnosis Date   Arthritis    Back pain    Dyspnea    ED (erectile dysfunction)    GERD (gastroesophageal reflux disease)    Hemorrhoids    Hyperlipidemia    Insomnia    Nocturia    PE (pulmonary embolism)    Rhinitis    Shoulder pain, bilateral    Urinary hesitancy     Past Surgical History:  Procedure Laterality Date   CARDIOVERSION N/A 11/16/2014   Procedure: CARDIOVERSION;  Surgeon: Sanda Klein, MD;  [facility];  Service: Cardiovascular;  Laterality: N/A;   COLONOSCOPY     TONSILLECTOMY AND ADENOIDECTOMY     VASECTOMY  1989    There were no vitals filed for this visit.   Subjective Assessment - 02/24/21 0810     Subjective Pt reports no changes since initial eval last week; reports he has the most dizziness when he is up walking - feels dizziness some in seated position, but not as much as it is in standing; sees Dr. Thornell Mule in a week    Patient is accompained by: Family member   [name]   Pertinent History Arthritis, Dyspnea, GERD, HLD, History of PE    Limitations Walking;Standing    Patient Stated Goals Get Rid of the Vertigo    Currently in Pain? No/denies                [facility] PT  Assessment - 02/24/21 0823       Functional Gait  Assessment   Gait Level Surface Walks 20 ft, slow speed, abnormal gait pattern, evidence for imbalance or deviates 10-15 in outside of the 12 in walkway width. Requires more than 7 sec to ambulate 20 ft.   7.09, 7.00   Change in Gait Speed Able to smoothly change walking speed without loss of balance or gait deviation. Deviate no more than 6 in outside of the 12 in walkway width.    Gait with Horizontal Head Turns Performs head turns with moderate changes in gait velocity, slows down, deviates 10-15 in outside 12 in walkway width but recovers, can continue to walk.    Gait with Vertical Head Turns Performs task with slight change in gait velocity (eg, minor disruption to smooth gait path), deviates 6 - 10 in outside 12 in walkway width or uses assistive device    Gait and Pivot Turn Pivot turns safely within 3 sec and stops quickly with no loss of balance.    Step Over Obstacle Is able to step over 2 stacked shoe boxes taped together (9 in total height) without  changing gait speed. No evidence of imbalance.    Gait with Narrow Base of Support Ambulates less than 4 steps heel to toe or cannot perform without assistance.    Gait with Eyes Closed Walks 20 ft, slow speed, abnormal gait pattern, evidence for imbalance, deviates 10-15 in outside 12 in walkway width. Requires more than 9 sec to ambulate 20 ft.    Ambulating Backwards Walks 20 ft, uses assistive device, slower speed, mild gait deviations, deviates 6-10 in outside 12 in walkway width.    Steps Alternating feet, no rail.    Total Score 19                   Static visual acuity - line 9 Dynamic visual acuity - line 4 --- 5 line difference - abnormal VOR; pt had much difficulty  attempting to read letters during testing         Vestibular Treatment/Exercise - 02/24/21 0826       Vestibular Treatment/Exercise   Vestibular Treatment Provided Gaze    Gaze Exercises X1  Viewing Horizontal;X1 Viewing Vertical      X1 Viewing Horizontal   Foot Position bil. stance - in standing   plain background   Time --   30 secs   Reps 1    Comments pt reported blurred vision - had to frequently cue pt to perform with smaller ROM      X1 Viewing Vertical   Foot Position bil stance - in standing   plain background   Time --   30 secs   Reps 1    Comments pt reported some blurred vision but not as much as with horizontal head turns             HEP - [employer]   Balance Exercises - 02/24/21 0001       Balance Exercises: Standing   Standing Eyes Opened Narrow base of support (BOS);Wide (BOA);Head turns;Solid surface;5 reps   horizontal & vertical head turns 5 reps each   Standing Eyes Closed Narrow base of support (BOS);Wide (BOA);Head turns;Solid surface;5 reps   horizontal & vertical head turns           HEP - from [place]  Access Code: [email or web address] URL: [email or web address] Date: 02/24/2021 Prepared by: Ethelene Browns  Exercises Standing Gaze Stabilization with Head Rotation - 1 x daily - 7 x weekly - 1 sets - 3-5 reps - 30 hold Romberg Stance with Head Nods - 1 x daily - 7 x weekly - 1 sets - 1-2 reps Romberg Stance - 1 x daily - 7 x weekly - 1 sets - 1-2 reps - 30 hold      PT Education - 02/24/21 0909     Education Details HEP - [employer] [id number]    Person(s) Educated Patient;Spouse    Methods Explanation;Demonstration;Handout    Comprehension Verbalized understanding;Returned demonstration              PT Short Term Goals - 02/24/21 0915       PT SHORT TERM GOAL #1   Title Patient will be independent with initial vestibular/balance HEP (ALL STGS Due: 03/11/21)    Baseline no HEP established    Time 3    Period Weeks    Status New    Target Date 03/11/21      PT SHORT TERM GOAL #2   Title Patient will improve situation 2 of M-CTSIB to >/= 30 seconds to demo improved balance    Baseline  20 seconds    Time 3     Period Weeks    Status New      PT SHORT TERM GOAL #3   Title FGA TBA and LTG to be set; Increase FGA score from 19/30 to >/= 23/30 for reduced fall risk.    Baseline TBA    Time 3    Period Weeks    Status New      PT SHORT TERM GOAL #4   Title DVA TBA and LTG to be set; Improve DGA to </= 4 line difference    Baseline TBA    Time 3    Period Weeks    Status New               PT Long Term Goals - 02/15/21 1714       PT LONG TERM GOAL #1   Title Patient will be independent with final vestibular/balance HEP (All LTGS Due: 04/01/21)    Baseline no HEP established    Time 6    Period Weeks    Status New    Target Date 04/01/21      PT LONG TERM GOAL #2   Title LTG to be set for FGA    Baseline TBA    Time 6    Period Weeks    Status New      PT LONG TERM GOAL #3   Title Patient will improve situation 4 of M-CTSIB to >/= 15 seconds to demo improved vestibular input for balance    Baseline <5 seconds    Time 6    Period Weeks    Status New      PT LONG TERM GOAL #4   Title LTG to be set for DVA    Baseline TBA    Time 6    Period Weeks    Status New      PT LONG TERM GOAL #5   Title Patient will improve DFS >/= 57%    Baseline DPS: 60.7 DFS: 44    Time 6    Period Weeks    Status New                   Plan - 02/24/21 0910     Clinical Impression Statement Pt's FGA score = 19/30, indicative of fall risk; pt had most diffiiculty with amb. with head turns, amb. with EC, and unable to perform tandem walking > 3 steps.  Pt also demonstrates abnormal VOR with DVA 5 line difference (line 9/line 4).  Pt reported increased dizziness after completion of FGA and standing with EC (with head turns intermittently incorporated) but not excessive increase limiting function.  Discussed vestibular hypofunction and possible need for compensatory techniques for habilitation - depending on evaluation by Dr. Thornell Mule with etiology and prognosis to be determined.  Cont  with POC.    Personal Factors and Comorbidities Comorbidity 3+;Age    Comorbidities Arthritis, Dyspnea, GERD, HLD, History of PE, A-Fib    Examination-Activity Limitations Locomotion Level;Transfers;Bend;Stairs    Examination-Participation Restrictions Community Activity;Occupation;Yard Work;Driving    Stability/Clinical Decision Making Stable/Uncomplicated    Rehab Potential Good    PT Frequency 2x / week    PT Duration 6 weeks    PT Treatment/Interventions ADLs/Self Care Home Management;Aquatic Therapy;Canalith Repostioning;Electrical Stimulation;Moist Heat;Cryotherapy;DME Instruction;Gait training;Stair training;Functional mobility training;Therapeutic activities;Therapeutic exercise;Balance training;Neuromuscular re-education;Patient/family education;Manual techniques;Vestibular;Dry needling    PT Next Visit Plan [place] - I set STG's for DVA & FGA - I will  let you set the LTG's based on what you think - Check HEP -  SLOW Progression    PT [place] [id number]    Consulted and Agree with Plan of Care Patient;Family member/caregiver    Family Member Consulted Wife Helene Kelp)             Patient will benefit from skilled therapeutic intervention in order to improve the following deficits and impairments:  Abnormal gait, Difficulty walking, Dizziness, Decreased activity tolerance, Decreased endurance, Decreased balance, Decreased strength  Visit Diagnosis: Dizziness and giddiness  Unsteadiness on feet     Problem List Patient Active Problem List   Diagnosis Date Noted   History of retinal detachment 05/03/2020   History of vitrectomy 05/03/2020   Macular pucker, left eye 05/03/2020   Intermediate stage nonexudative age-related macular degeneration of both eyes 10/30/2019   Longstanding persistent atrial fibrillation ([place])    Paroxysmal atrial fibrillation ([place]) 10/12/2014    Marjo Grosvenor, [place], PT 02/24/2021, 9:18 AM  [facility] [address] [place], [place], [address] Phone: [phone]   Fax:  [phone]  Name: Robert Hart MRN: [id number] Date of Birth: [birth year]

## 2021-02-28 ENCOUNTER — Other Ambulatory Visit (HOSPITAL_COMMUNITY): Payer: Self-pay | Admitting: Nurse Practitioner

## 2021-03-01 ENCOUNTER — Ambulatory Visit: Payer: Medicare Other | Admitting: Physical Therapy

## 2021-03-01 ENCOUNTER — Ambulatory Visit: Payer: Medicare Other

## 2021-03-01 ENCOUNTER — Other Ambulatory Visit: Payer: Self-pay

## 2021-03-01 DIAGNOSIS — M6281 Muscle weakness (generalized): Secondary | ICD-10-CM | POA: Diagnosis not present

## 2021-03-01 DIAGNOSIS — R2681 Unsteadiness on feet: Secondary | ICD-10-CM

## 2021-03-01 DIAGNOSIS — R2689 Other abnormalities of gait and mobility: Secondary | ICD-10-CM | POA: Diagnosis not present

## 2021-03-01 DIAGNOSIS — R42 Dizziness and giddiness: Secondary | ICD-10-CM

## 2021-03-01 NOTE — Patient Instructions (Signed)
Access Code: [email or web address] URL: [email or web address] Date: 03/01/2021 Prepared by: Misty Stanley  Exercises Standing Gaze Stabilization with Head Rotation - 2-3 x daily - 7 x weekly - 1 sets - 45 hold Standing Gaze Stabilization with Head Nod - 2-3 x daily - 7 x weekly - 1 sets - 45 second hold Standing with Eyes Closed - 2 x daily - 7 x weekly - 2 sets - 10 reps

## 2021-03-02 NOTE — Therapy (Signed)
[facility] [address] [place], [place], [address] Phone: [phone]   Fax:  [phone]  Physical Therapy Treatment  Patient Details  Name: Robert Hart MRN: [id number] Date of Birth: [birth year] Referring Provider (PT): Melida Quitter, MD   Encounter Date: 03/01/2021   PT End of Session - 03/01/21 0854     Visit Number 3    Number of Visits 13    Date for PT Re-Evaluation 04/01/21    Authorization Type Medicare + BCBS    Progress Note Due on Visit 10    PT Start Time 0852    PT Stop Time 0934    PT Time Calculation (min) 42 min    Activity Tolerance Patient tolerated treatment well    Behavior During Therapy [facility] for tasks assessed/performed             Past Medical History:  Diagnosis Date   Arthritis    Back pain    Dyspnea    ED (erectile dysfunction)    GERD (gastroesophageal reflux disease)    Hemorrhoids    Hyperlipidemia    Insomnia    Nocturia    PE (pulmonary embolism)    Rhinitis    Shoulder pain, bilateral    Urinary hesitancy     Past Surgical History:  Procedure Laterality Date   CARDIOVERSION N/A 11/16/2014   Procedure: CARDIOVERSION;  Surgeon: Sanda Klein, MD;  Location: [facility];  Service: Cardiovascular;  Laterality: N/A;   COLONOSCOPY     TONSILLECTOMY AND ADENOIDECTOMY     VASECTOMY  1989    There were no vitals filed for this visit.   Subjective Assessment - 03/01/21 0856     Subjective Appointment with Dr. Thornell Mule is next Thursday.  Has been performing all the exercises 2x/day.  Is not seeing a big change yet.  Still having most symptoms with EC and when walking - feels more like imbalance.  "My family keeps asking me if she checked 'the crystals'."    Patient is accompained by: Family member   Robert Hart   Pertinent History Arthritis, Dyspnea, GERD, HLD, History of PE    Limitations Walking;Standing    Patient Stated Goals Get Rid of the Vertigo    Currently in  Pain? No/denies              Vestibular Assessment - 03/01/21 0900       Positional Testing   Dix-Hallpike Dix-Hallpike Right;Dix-Hallpike Left    Horizontal Canal Testing Horizontal Canal Right;Horizontal Canal Left      Dix-Hallpike Right   Dix-Hallpike Right Duration 0    Dix-Hallpike Right Symptoms No nystagmus      Dix-Hallpike Left   Dix-Hallpike Left Duration 0    Dix-Hallpike Left Symptoms No nystagmus      Horizontal Canal Right   Horizontal Canal Right Duration 0    Horizontal Canal Right Symptoms Normal      Horizontal Canal Left   Horizontal Canal Left Duration 0    Horizontal Canal Left Symptoms Normal      Positional Sensitivities   Sit to Supine No dizziness    Supine to Left Side No dizziness    Supine to Right Side No dizziness    Supine to Sitting No dizziness    Right Hallpike No dizziness    Up from Right Hallpike No dizziness    Up from Left Hallpike No dizziness    Rolling Right No dizziness    Rolling Left No  dizziness               Vestibular Treatment/Exercise - 03/01/21 0909       Vestibular Treatment/Exercise   Vestibular Treatment Provided Gaze    Gaze Exercises X1 Viewing Horizontal;X1 Viewing Vertical      X1 Viewing Horizontal   Foot Position standing feet apart, plain background; glasses removed    Reps 3    Comments increased speed of head movement and then increased time from 30 seconds > 45      X1 Viewing Vertical   Foot Position standing feet apart, plain background; glasses removed    Reps 3    Comments increased speed of head movement and then increased time from 30 seconds > 45              Balance Exercises - 03/01/21 0931       Balance Exercises: Standing   Standing Eyes Closed Narrow base of support (BOS);Head turns;Solid surface;Other reps (comment);30 secs;Limitations    Standing Eyes Closed Limitations feet together holding x 30 > progressed to adding head turns/nods with increased sway.   Attempted feet staggered with EC but unable to maintain balance               PT Education - 03/01/21 0932     Education Details updated HEP, no indication of BPPV    Person(s) Educated Patient    Methods Explanation;Demonstration;Handout    Comprehension Verbalized understanding;Returned demonstration            Access Code: [email or web address] URL: [email or web address] Date: 03/01/2021 Prepared by: Misty Stanley  Exercises Standing Gaze Stabilization with Head Rotation - 2-3 x daily - 7 x weekly - 1 sets - 45 hold Standing Gaze Stabilization with Head Nod - 2-3 x daily - 7 x weekly - 1 sets - 45 second hold Standing with Eyes Closed - 2 x daily - 7 x weekly - 2 sets - 10 reps    PT Short Term Goals - 02/24/21 0915       PT SHORT TERM GOAL #1   Title Patient will be independent with initial vestibular/balance HEP (ALL STGS Due: 03/11/21)    Baseline no HEP established    Time 3    Period Weeks    Status New    Target Date 03/11/21      PT SHORT TERM GOAL #2   Title Patient will improve situation 2 of M-CTSIB to >/= 30 seconds to demo improved balance    Baseline 20 seconds    Time 3    Period Weeks    Status New      PT SHORT TERM GOAL #3   Title FGA TBA and LTG to be set; Increase FGA score from 19/30 to >/= 23/30 for reduced fall risk.    Baseline TBA    Time 3    Period Weeks    Status New      PT SHORT TERM GOAL #4   Title DVA TBA and LTG to be set; Improve DGA to </= 4 line difference    Baseline TBA    Time 3    Period Weeks    Status New               PT Long Term Goals - 02/15/21 1714       PT LONG TERM GOAL #1   Title Patient will be independent with final vestibular/balance HEP (All LTGS Due: 04/01/21)    Baseline no HEP  established    Time 6    Period Weeks    Status New    Target Date 04/01/21      PT LONG TERM GOAL #2   Title LTG to be set for FGA    Baseline TBA    Time 6    Period Weeks    Status New      PT  LONG TERM GOAL #3   Title Patient will improve situation 4 of M-CTSIB to >/= 15 seconds to demo improved vestibular input for balance    Baseline <5 seconds    Time 6    Period Weeks    Status New      PT LONG TERM GOAL #4   Title LTG to be set for DVA    Baseline TBA    Time 6    Period Weeks    Status New      PT LONG TERM GOAL #5   Title Patient will improve DFS >/= 57%    Baseline DPS: 60.7 DFS: 44    Time 6    Period Weeks    Status New                   Plan - 03/02/21 8022     Clinical Impression Statement Treatment session focused on continued education regarding vestibular and balance impairments.  No evidence of position vertigo when assessed.  Continued to review and upgrade HEP to continue progress.    Personal Factors and Comorbidities Comorbidity 3+;Age    Comorbidities Arthritis, Dyspnea, GERD, HLD, History of PE, A-Fib    Examination-Activity Limitations Locomotion Level;Transfers;Bend;Stairs    Examination-Participation Restrictions Community Activity;Occupation;Yard Work;Driving    Stability/Clinical Decision Making Stable/Uncomplicated    Rehab Potential Good    PT Frequency 2x / week    PT Duration 6 weeks    PT Treatment/Interventions ADLs/Self Care Home Management;Aquatic Therapy;Canalith Repostioning;Electrical Stimulation;Moist Heat;Cryotherapy;DME Instruction;Gait training;Stair training;Functional mobility training;Therapeutic activities;Therapeutic exercise;Balance training;Neuromuscular re-education;Patient/family education;Manual techniques;Vestibular;Dry needling    PT Next Visit Plan Continue to update and progress HEP.  Focus on balance with eyes closed, compliant surfaces, more visually complex environments    PT Home Exercise Plan [employer] [email or web address]    Consulted and Agree with Plan of Care Patient;Family member/caregiver    Family Member Consulted Wife Helene Kelp)             Patient will benefit from skilled therapeutic  intervention in order to improve the following deficits and impairments:  Abnormal gait, Difficulty walking, Dizziness, Decreased activity tolerance, Decreased endurance, Decreased balance, Decreased strength  Visit Diagnosis: Dizziness and giddiness  Unsteadiness on feet  Muscle weakness (generalized)  Other abnormalities of gait and mobility     Problem List Patient Active Problem List   Diagnosis Date Noted   History of retinal detachment 05/03/2020   History of vitrectomy 05/03/2020   Macular pucker, left eye 05/03/2020   Intermediate stage nonexudative age-related macular degeneration of both eyes 10/30/2019   Longstanding persistent atrial fibrillation ([place])    Paroxysmal atrial fibrillation ([place]) 10/12/2014    Rico Junker, PT, DPT 03/02/21    8:31 AM    [facility] [address] [place], [place], [address] Phone: [phone]   Fax:  [phone]  Name: Robert Hart MRN: [id number] Date of Birth: 03/18/1951

## 2021-03-04 ENCOUNTER — Ambulatory Visit: Payer: Medicare Other

## 2021-03-08 ENCOUNTER — Ambulatory Visit: Payer: Medicare Other

## 2021-03-09 ENCOUNTER — Encounter (HOSPITAL_BASED_OUTPATIENT_CLINIC_OR_DEPARTMENT_OTHER): Payer: Self-pay

## 2021-03-09 ENCOUNTER — Ambulatory Visit (HOSPITAL_BASED_OUTPATIENT_CLINIC_OR_DEPARTMENT_OTHER): Admit: 2021-03-09 | Payer: Medicare Other | Admitting: Otolaryngology

## 2021-03-09 SURGERY — SURGERY, PARANASAL SINUS, ENDOSCOPIC, WITH NASAL SEPTOPLASTY, TURBINOPLASTY, AND MAXILLARY SINUSOTOMY
Anesthesia: General | Laterality: Left

## 2021-03-10 DIAGNOSIS — R42 Dizziness and giddiness: Secondary | ICD-10-CM | POA: Diagnosis not present

## 2021-03-10 DIAGNOSIS — H9121 Sudden idiopathic hearing loss, right ear: Secondary | ICD-10-CM | POA: Diagnosis not present

## 2021-03-10 DIAGNOSIS — H9311 Tinnitus, right ear: Secondary | ICD-10-CM | POA: Diagnosis not present

## 2021-03-10 DIAGNOSIS — H918X2 Other specified hearing loss, left ear: Secondary | ICD-10-CM | POA: Diagnosis not present

## 2021-03-10 DIAGNOSIS — H903 Sensorineural hearing loss, bilateral: Secondary | ICD-10-CM | POA: Diagnosis not present

## 2021-03-10 DIAGNOSIS — Z87891 Personal history of nicotine dependence: Secondary | ICD-10-CM | POA: Diagnosis not present

## 2021-03-10 DIAGNOSIS — J322 Chronic ethmoidal sinusitis: Secondary | ICD-10-CM | POA: Diagnosis not present

## 2021-03-11 ENCOUNTER — Ambulatory Visit: Payer: Medicare Other

## 2021-03-15 ENCOUNTER — Other Ambulatory Visit: Payer: Self-pay

## 2021-03-15 ENCOUNTER — Ambulatory Visit: Payer: Medicare Other

## 2021-03-15 DIAGNOSIS — R42 Dizziness and giddiness: Secondary | ICD-10-CM

## 2021-03-15 DIAGNOSIS — R2681 Unsteadiness on feet: Secondary | ICD-10-CM

## 2021-03-15 DIAGNOSIS — M6281 Muscle weakness (generalized): Secondary | ICD-10-CM | POA: Diagnosis not present

## 2021-03-15 DIAGNOSIS — R2689 Other abnormalities of gait and mobility: Secondary | ICD-10-CM

## 2021-03-15 NOTE — Patient Instructions (Signed)
Access Code: [email or web address] URL: [email or web address] Date: 03/15/2021 Prepared by: Baldomero Lamy  Exercises Standing Gaze Stabilization with Head Rotation - 2-3 x daily - 7 x weekly - 1 sets - 45 hold Standing Gaze Stabilization with Head Nod - 2-3 x daily - 7 x weekly - 1 sets - 45 second hold Standing Balance with Eyes Closed on Foam - 1 x daily - 7 x weekly - 1 sets - 3 reps - 30 seconds hold Half Tandem Stance Balance with Head Rotation - 1 x daily - 7 x weekly - 2 sets - 10 reps Walking with Head Rotation - 1 x daily - 7 x weekly - 1 sets - 3-4 reps

## 2021-03-15 NOTE — Therapy (Signed)
[facility] [address] [place], [place], [address] Phone: [phone]   Fax:  [phone]  Physical Therapy Treatment  Patient Details  Name: Robert Hart MRN: [id number] Date of Birth: [birth year] Referring Provider (PT): Melida Quitter, MD   Encounter Date: 03/15/2021   PT End of Session - 03/15/21 1750     Visit Number 4    Number of Visits 13    Date for PT Re-Evaluation 04/01/21    Authorization Type Medicare + BCBS    Progress Note Due on Visit 10    PT Start Time 1701    PT Stop Time 1744    PT Time Calculation (min) 43 min    Equipment Utilized During Treatment Gait belt    Activity Tolerance Patient tolerated treatment well    Behavior During Therapy WFL for tasks assessed/performed             Past Medical History:  Diagnosis Date   Arthritis    Back pain    Dyspnea    ED (erectile dysfunction)    GERD (gastroesophageal reflux disease)    Hemorrhoids    Hyperlipidemia    Insomnia    Nocturia    PE (pulmonary embolism)    Rhinitis    Shoulder pain, bilateral    Urinary hesitancy     Past Surgical History:  Procedure Laterality Date   CARDIOVERSION N/A 11/16/2014   Procedure: CARDIOVERSION;  Surgeon: Sanda Klein, MD;  Location: [facility];  Service: Cardiovascular;  Laterality: N/A;   COLONOSCOPY     TONSILLECTOMY AND ADENOIDECTOMY     VASECTOMY  1989    There were no vitals filed for this visit.   Subjective Assessment - 03/15/21 1703     Subjective Reports he wants to have a cochlear implant. Still doing the exercises. No new changes/complaints. Mild dizziness    Patient is accompained by: Family member   Clarene Hart   Pertinent History Arthritis, Dyspnea, GERD, HLD, History of PE    Limitations Walking;Standing    Patient Stated Goals Get Rid of the Vertigo    Currently in Pain? No/denies                Balance Exercises - 03/15/21 0001       Balance Exercises:  Standing   Standing Eyes Opened Narrow base of support (BOS);Head turns;Foam/compliant surface;Limitations    Standing Eyes Opened Limitations x 10 reps horizonta/vertical    Standing Eyes Closed Wide (BOA);Foam/compliant surface;3 reps;30 secs    Standing Eyes Closed Limitations on airex; postural sway noted.    Tandem Stance Eyes open;1 rep;30 secs;Limitations    Tandem Stance Time tandem x 30 seconds, then half tandem with horizontal/vertical head turns x 10 reps each    Rockerboard Anterior/posterior;EO;Intermittent UE support;Limitations    Rockerboard Limitations standing with rockerboard A/P and EO without UE support 2 x 30 seconds working on maintaining board steady, then completed alternating UE raises x 10 reps bilat. Progressed to A/P weight shift x 15 reps working on improved control. intemrittent touch A required.    Gait with Head Turns Forward;Intermittent upper extremity support;4 reps;Limitations    Gait with Head Turns Limitations x 4 laps down and back, cues for speed of head turn. Added to HEP    Tandem Gait Forward;Intermittent upper extremity support;4 reps;Limitations    Tandem Gait Limitations x 4 laps down/back in // bars, intermittent UE support required  Access Code: [email or web address] URL: [email or web address] Date: 03/15/2021 Prepared by: Baldomero Lamy  Exercises Standing Gaze Stabilization with Head Rotation - 2-3 x daily - 7 x weekly - 1 sets - 45 hold Standing Gaze Stabilization with Head Nod - 2-3 x daily - 7 x weekly - 1 sets - 45 second hold Standing Balance with Eyes Closed on Foam - 1 x daily - 7 x weekly - 1 sets - 3 reps - 30 seconds hold Half Tandem Stance Balance with Head Rotation - 1 x daily - 7 x weekly - 2 sets - 10 reps Walking with Head Rotation - 1 x daily - 7 x weekly - 1 sets - 3-4 reps     PT Education - 03/15/21 1750     Education Details HEP Update    Person(s) Educated Patient    Methods  Explanation;Demonstration;Handout    Comprehension Verbalized understanding;Returned demonstration              PT Short Term Goals - 02/24/21 0915       PT SHORT TERM GOAL #1   Title Patient will be independent with initial vestibular/balance HEP (ALL STGS Due: 03/11/21)    Baseline no HEP established    Time 3    Period Weeks    Status New    Target Date 03/11/21      PT SHORT TERM GOAL #2   Title Patient will improve situation 2 of M-CTSIB to >/= 30 seconds to demo improved balance    Baseline 20 seconds    Time 3    Period Weeks    Status New      PT SHORT TERM GOAL #3   Title FGA TBA and LTG to be set; Increase FGA score from 19/30 to >/= 23/30 for reduced fall risk.    Baseline TBA    Time 3    Period Weeks    Status New      PT SHORT TERM GOAL #4   Title DVA TBA and LTG to be set; Improve DGA to </= 4 line difference    Baseline TBA    Time 3    Period Weeks    Status New               PT Long Term Goals - 02/15/21 1714       PT LONG TERM GOAL #1   Title Patient will be independent with final vestibular/balance HEP (All LTGS Due: 04/01/21)    Baseline no HEP established    Time 6    Period Weeks    Status New    Target Date 04/01/21      PT LONG TERM GOAL #2   Title LTG to be set for FGA    Baseline TBA    Time 6    Period Weeks    Status New      PT LONG TERM GOAL #3   Title Patient will improve situation 4 of M-CTSIB to >/= 15 seconds to demo improved vestibular input for balance    Baseline <5 seconds    Time 6    Period Weeks    Status New      PT LONG TERM GOAL #4   Title LTG to be set for DVA    Baseline TBA    Time 6    Period Weeks    Status New      PT LONG TERM GOAL #5   Title Patient will improve DFS >/= 57%  Baseline DPS: 60.7 DFS: 44    Time 6    Period Weeks    Status New                   Plan - 03/15/21 1753     Clinical Impression Statement Continued session focused on balance, and  progressing balance HEP as patient reports exericses are getting easier. minimal dizziness today reported during session. Will continue to progress toward all LTGs.    Personal Factors and Comorbidities Comorbidity 3+;Age    Comorbidities Arthritis, Dyspnea, GERD, HLD, History of PE, A-Fib    Examination-Activity Limitations Locomotion Level;Transfers;Bend;Stairs    Examination-Participation Restrictions Community Activity;Occupation;Yard Work;Driving    Stability/Clinical Decision Making Stable/Uncomplicated    Rehab Potential Good    PT Frequency 2x / week    PT Duration 6 weeks    PT Treatment/Interventions ADLs/Self Care Home Management;Aquatic Therapy;Canalith Repostioning;Electrical Stimulation;Moist Heat;Cryotherapy;DME Instruction;Gait training;Stair training;Functional mobility training;Therapeutic activities;Therapeutic exercise;Balance training;Neuromuscular re-education;Patient/family education;Manual techniques;Vestibular;Dry needling    PT Next Visit Plan Please check STGS, did not realize they were due until end of session. how was HEP Update?Marland Kitchen  Focus on balance with eyes closed, compliant surfaces, more visually complex environments. visual tracking    PT [place] [email or web address]    Consulted and Agree with Plan of Care Patient;Family member/caregiver    Family Member Consulted Wife Helene Kelp)             Patient will benefit from skilled therapeutic intervention in order to improve the following deficits and impairments:  Abnormal gait, Difficulty walking, Dizziness, Decreased activity tolerance, Decreased endurance, Decreased balance, Decreased strength  Visit Diagnosis: Dizziness and giddiness  Unsteadiness on feet  Other abnormalities of gait and mobility     Problem List Patient Active Problem List   Diagnosis Date Noted   History of retinal detachment 05/03/2020   History of vitrectomy 05/03/2020   Macular pucker, left eye 05/03/2020    Intermediate stage nonexudative age-related macular degeneration of both eyes 10/30/2019   Longstanding persistent atrial fibrillation ([place])    Paroxysmal atrial fibrillation ([place]) 10/12/2014    Jones Bales, PT, DPT 03/15/2021, 5:55 PM  [facility] [address] [place], [place], [address] Phone: [phone]   Fax:  [phone]  Name: ALEJOS REINHARDT MRN: [id number] Date of Birth: 1950/11/10

## 2021-03-17 ENCOUNTER — Ambulatory Visit: Payer: Medicare Other | Admitting: Physical Therapy

## 2021-03-17 ENCOUNTER — Other Ambulatory Visit: Payer: Self-pay

## 2021-03-17 DIAGNOSIS — R2681 Unsteadiness on feet: Secondary | ICD-10-CM

## 2021-03-17 DIAGNOSIS — R42 Dizziness and giddiness: Secondary | ICD-10-CM

## 2021-03-17 DIAGNOSIS — R2689 Other abnormalities of gait and mobility: Secondary | ICD-10-CM | POA: Diagnosis not present

## 2021-03-17 DIAGNOSIS — M6281 Muscle weakness (generalized): Secondary | ICD-10-CM | POA: Diagnosis not present

## 2021-03-18 NOTE — Therapy (Signed)
[facility] [address] [place], [place], [address] Phone: [phone]   Fax:  [phone]  Physical Therapy Treatment  Patient Details  Name: Robert Hart MRN: [id number] Date of Birth: July 31, 1950 Referring Provider (PT): Melida Quitter, MD   Encounter Date: 03/17/2021   PT End of Session - 03/18/21 0912     Visit Number 5    Number of Visits 13    Date for PT Re-Evaluation 04/01/21    Authorization Type Medicare + BCBS    Progress Note Due on Visit 10    PT Start Time 1450    PT Stop Time 1530    PT Time Calculation (min) 40 min    Equipment Utilized During Treatment Gait belt    Activity Tolerance Patient tolerated treatment well    Behavior During Therapy WFL for tasks assessed/performed             Past Medical History:  Diagnosis Date   Arthritis    Back pain    Dyspnea    ED (erectile dysfunction)    GERD (gastroesophageal reflux disease)    Hemorrhoids    Hyperlipidemia    Insomnia    Nocturia    PE (pulmonary embolism)    Rhinitis    Shoulder pain, bilateral    Urinary hesitancy     Past Surgical History:  Procedure Laterality Date   CARDIOVERSION N/A 11/16/2014   Procedure: CARDIOVERSION;  Surgeon: Sanda Klein, MD;  Location: [facility];  Service: Cardiovascular;  Laterality: N/A;   COLONOSCOPY     TONSILLECTOMY AND ADENOIDECTOMY     VASECTOMY  1989    There were no vitals filed for this visit.   Subjective Assessment - 03/18/21 0900     Subjective Pt reports he is doing a little better - is making progress slowly; reports he is doing HEP regularly at home    Patient is accompained by: Family member   [name]   Pertinent History Arthritis, Dyspnea, GERD, HLD, History of PE    Limitations Walking;Standing    Patient Stated Goals Get Rid of the Vertigo    Currently in Pain? No/denies                               [facility] Adult PT Treatment/Exercise -  03/18/21 0001       High Level Balance   High Level Balance Activities Backward walking;Turns;Sudden stops;Head turns                 Balance Exercises - 03/18/21 0001       Balance Exercises: Standing   Standing Eyes Opened Narrow base of support (BOS);Wide (BOA);Head turns;Foam/compliant surface   horizontal & vertical head turns 5 reps each   Standing Eyes Opened Limitations 5 reps horizontal and vertical head turns    Standing Eyes Closed Head turns;30 secs;Limitations;Wide (BOA);Foam/compliant surface;5 reps    Standing Eyes Closed Limitations on Airex inside // bars    Rockerboard Anterior/posterior;Head turns;EO;EC;10 reps   horizontal & vertical head turns   Rockerboard Limitations standing on board attempting to hold board steady - performed head turns horizontal and vertical 10 reps each with UE support prn    Gait with Head Turns Forward;2 reps   40' x 4; 2 reps with horizontal and 2 reps with vertical head turns   Other Standing Exercises Pt performed standing on Bosu - on inverted side initially inside //  bars with bil. UE support - pt had significant tremors in bil. LE's so this ex. was discontinued; changed Bosu to compliant side - pt still had tremors but not as intense;  pt performed squats 5 reps EO on Bosu, then 5 reps with EC with bil UE support with each; pt performed lateral weight shifts 5 reps on Bosu with UE support            Pt performed ambulation forward 40' x 1 rep tossing & catching ball - then tossing and catching ball on Rt/Lt sides 40' x 1 rep with SBA - pt had no LOB with this activity      PT Education - 03/18/21 0901     Education Details added head turns with standing on pillows with EC; standing on Bosu (pt reports he has one) ONLY if he is able to set up to be able to hold onto something for safety, and recommended pt perform x1 viewing for 60 secs 1 rep but 3- 5 times/day    Person(s) Educated Patient    Methods Explanation     Comprehension Verbalized understanding;Returned demonstration              PT Short Term Goals - 03/18/21 0917       PT SHORT TERM GOAL #1   Title Patient will be independent with initial vestibular/balance HEP (ALL STGS Due: 03/11/21)    Baseline no HEP established ; met 03-17-21    Time 3    Period Weeks    Status Achieved    Target Date 03/11/21      PT SHORT TERM GOAL #2   Title Patient will improve situation 2 of M-CTSIB to >/= 30 seconds to demo improved balance    Baseline 20 seconds; met 03-17-21 - 32 secs    Time 3    Period Weeks    Status Achieved      PT SHORT TERM GOAL #3   Title FGA TBA and LTG to be set; Increase FGA score from 19/30 to >/= 23/30 for reduced fall risk.    Baseline TBA    Time 3    Period Weeks    Status New      PT SHORT TERM GOAL #4   Title DVA TBA and LTG to be set; Improve DGA to </= 4 line difference    Baseline TBA    Time 3    Period Weeks    Status New               PT Long Term Goals - 03/18/21 5462       PT LONG TERM GOAL #1   Title Patient will be independent with final vestibular/balance HEP (All LTGS Due: 04/01/21)    Baseline no HEP established    Time 6    Period Weeks    Status New      PT LONG TERM GOAL #2   Title LTG to be set for FGA    Baseline TBA    Time 6    Period Weeks    Status New      PT LONG TERM GOAL #3   Title Patient will improve situation 4 of M-CTSIB to >/= 15 seconds to demo improved vestibular input for balance    Baseline <5 seconds    Time 6    Period Weeks    Status New      PT LONG TERM GOAL #4   Title LTG to be  set for DVA    Baseline TBA    Time 6    Period Weeks    Status New      PT LONG TERM GOAL #5   Title Patient will improve DFS >/= 57%    Baseline DPS: 60.7 DFS: 44    Time 6    Period Weeks    Status New                   Plan - 03/18/21 0912     Clinical Impression Statement Pt had moderate sway with amb. with horizontal head turns at  beginning of today's session, but was much improved with this activity at end of session with less postural sway and deviation in path noted.  No significant difference in sway/path deviation noted with horizontal vs. vertical head turns.  Progressed HEP by adding head turns with standing on pillows with EC.  Pt is progressing towards LTG's - cont with POC.    Personal Factors and Comorbidities Comorbidity 3+;Age    Comorbidities Arthritis, Dyspnea, GERD, HLD, History of PE, A-Fib    Examination-Activity Limitations Locomotion Level;Transfers;Bend;Stairs    Examination-Participation Restrictions Community Activity;Occupation;Yard Work;Driving    Stability/Clinical Decision Making Stable/Uncomplicated    Rehab Potential Good    PT Frequency 2x / week    PT Duration 6 weeks    PT Treatment/Interventions ADLs/Self Care Home Management;Aquatic Therapy;Canalith Repostioning;Electrical Stimulation;Moist Heat;Cryotherapy;DME Instruction;Gait training;Stair training;Functional mobility training;Therapeutic activities;Therapeutic exercise;Balance training;Neuromuscular re-education;Patient/family education;Manual techniques;Vestibular;Dry needling    PT Next Visit Plan Please check STG's #3 and 4 - Focus on balance with eyes closed, compliant surfaces, more visually complex environments. visual tracking    PT [place] [id number]    Consulted and Agree with Plan of Care Patient;Family member/caregiver    Family Member Consulted Wife Helene Kelp)             Patient will benefit from skilled therapeutic intervention in order to improve the following deficits and impairments:  Abnormal gait, Difficulty walking, Dizziness, Decreased activity tolerance, Decreased endurance, Decreased balance, Decreased strength  Visit Diagnosis: Dizziness and giddiness  Unsteadiness on feet  Other abnormalities of gait and mobility     Problem List Patient Active Problem List   Diagnosis Date Noted    History of retinal detachment 05/03/2020   History of vitrectomy 05/03/2020   Macular pucker, left eye 05/03/2020   Intermediate stage nonexudative age-related macular degeneration of both eyes 10/30/2019   Longstanding persistent atrial fibrillation ([place])    Paroxysmal atrial fibrillation ([place]) 10/12/2014    Kaidon Kinker, [place], PT 03/18/2021, 9:27 AM  [facility] [address] [place], [place], [address] Phone: [phone]   Fax:  [phone]  Name: NKOSI CORTRIGHT MRN: [id number] Date of Birth: [birth year]

## 2021-03-22 ENCOUNTER — Other Ambulatory Visit: Payer: Self-pay

## 2021-03-22 ENCOUNTER — Ambulatory Visit: Payer: Medicare Other | Attending: Otolaryngology

## 2021-03-22 DIAGNOSIS — R2681 Unsteadiness on feet: Secondary | ICD-10-CM | POA: Insufficient documentation

## 2021-03-22 DIAGNOSIS — R42 Dizziness and giddiness: Secondary | ICD-10-CM | POA: Diagnosis not present

## 2021-03-22 DIAGNOSIS — R2689 Other abnormalities of gait and mobility: Secondary | ICD-10-CM | POA: Insufficient documentation

## 2021-03-22 DIAGNOSIS — M6281 Muscle weakness (generalized): Secondary | ICD-10-CM | POA: Insufficient documentation

## 2021-03-22 NOTE — Therapy (Signed)
[facility] [address] [place], [place], [address] Phone: [phone]   Fax:  [phone]  Physical Therapy Treatment  Patient Details  Name: Robert Hart MRN: [id number] Date of Birth: 1951/04/13 Referring Provider (PT): Melida Quitter, MD   Encounter Date: 03/22/2021   PT End of Session - 03/22/21 1700     Visit Number 6    Number of Visits 13    Date for PT Re-Evaluation 04/01/21    Authorization Type Medicare + BCBS    Progress Note Due on Visit 10    PT Start Time 1700    PT Stop Time 1745    PT Time Calculation (min) 45 min    Equipment Utilized During Treatment Gait belt    Activity Tolerance Patient tolerated treatment well    Behavior During Therapy WFL for tasks assessed/performed             Past Medical History:  Diagnosis Date   Arthritis    Back pain    Dyspnea    ED (erectile dysfunction)    GERD (gastroesophageal reflux disease)    Hemorrhoids    Hyperlipidemia    Insomnia    Nocturia    PE (pulmonary embolism)    Rhinitis    Shoulder pain, bilateral    Urinary hesitancy     Past Surgical History:  Procedure Laterality Date   CARDIOVERSION N/A 11/16/2014   Procedure: CARDIOVERSION;  Surgeon: Sanda Klein, MD;  Location: [facility];  Service: Cardiovascular;  Laterality: N/A;   COLONOSCOPY     TONSILLECTOMY AND ADENOIDECTOMY     VASECTOMY  1989    There were no vitals filed for this visit.   Subjective Assessment - 03/22/21 1700     Subjective No new changes or complaints.    Patient is accompained by: Family member   Robert Hart   Pertinent History Arthritis, Dyspnea, GERD, HLD, History of PE    Limitations Walking;Standing    Patient Stated Goals Get Rid of the Vertigo    Currently in Pain? No/denies                               [facility] Adult PT Treatment/Exercise - 03/22/21 0001       Neuro Re-ed    Neuro Re-ed Details  standing on incline on  blue mat with staggered stance, completed static standing EO x 30 seconds working on upright posture and alignment, then progressed to horiz/vertical head turn x 10 reps each maintaining this position, then EC 3 x 30 seconds.Switched foot position and completed all of the following again. more challenge noted with RLE posterior. Completed ambulation with self ball toss with visual tracking, 35-40' one way, x 6 reps. Initially patient demo decreased visual tracking downward, with able to correct with cues. More unsteadiness noted with full range tracking. Then progressed to completed trunk rotation wtih ball/toss catch with ambulation progression of speed as able x 4 reps. Then with playing cards, PT called out R/L side and patient completing head turn and search for card on prospective side in various directions and recall card to PT, completed x 4 reps with progression of pace. intermittent standing rest breaks.                 Balance Exercises - 03/22/21 0001       Balance Exercises: Standing   Standing Eyes Closed Head turns;30 secs;Limitations;Wide (BOA);Foam/compliant surface;Narrow base  of support (BOS);3 reps    Standing Eyes Closed Limitations standing on airex, EC x 30 seconds. then with head turns x 10 reps horiz/vertical x 10 reps each. provided handout for HEP    Tandem Gait Forward;Intermittent upper extremity support;4 reps;Limitations    Tandem Gait Limitations completed x 3 laps along countertop, intermittent touch A for balance.    Other Standing Exercises standing on Bosu - on inverted side initially inside // bars with bil. completed static standing on inverted, 2 x 30 seconds without UE support, very shaky therefore require close supervision/CGA. then with light touch completed horiz/vertical head turns x 10 reps. more challenge noted with horizontal > vertical. Completed A/P and lateral weight shift on inverted BOSU x 10 reps each direction, focusing on control with  completion.                PT Education - 03/22/21 1747     Education Details added additions from previous session to [place] and provided handout    Person(s) Educated Patient    Methods Explanation;Demonstration;Handout    Comprehension Verbalized understanding;Returned demonstration              PT Short Term Goals - 03/18/21 0917       PT SHORT TERM GOAL #1   Title Patient will be independent with initial vestibular/balance HEP (ALL STGS Due: 03/11/21)    Baseline no HEP established ; met 03-17-21    Time 3    Period Weeks    Status Achieved    Target Date 03/11/21      PT SHORT TERM GOAL #2   Title Patient will improve situation 2 of M-CTSIB to >/= 30 seconds to demo improved balance    Baseline 20 seconds; met 03-17-21 - 32 secs    Time 3    Period Weeks    Status Achieved      PT SHORT TERM GOAL #3   Title FGA TBA and LTG to be set; Increase FGA score from 19/30 to >/= 23/30 for reduced fall risk.    Baseline TBA    Time 3    Period Weeks    Status New      PT SHORT TERM GOAL #4   Title DVA TBA and LTG to be set; Improve DGA to </= 4 line difference    Baseline TBA    Time 3    Period Weeks    Status New               PT Long Term Goals - 03/18/21 5170       PT LONG TERM GOAL #1   Title Patient will be independent with final vestibular/balance HEP (All LTGS Due: 04/01/21)    Baseline no HEP established    Time 6    Period Weeks    Status New      PT LONG TERM GOAL #2   Title LTG to be set for FGA    Baseline TBA    Time 6    Period Weeks    Status New      PT LONG TERM GOAL #3   Title Patient will improve situation 4 of M-CTSIB to >/= 15 seconds to demo improved vestibular input for balance    Baseline <5 seconds    Time 6    Period Weeks    Status New      PT LONG TERM GOAL #4   Title LTG to be set for [facility]  Baseline TBA    Time 6    Period Weeks    Status New      PT LONG TERM GOAL #5   Title Patient will  improve DFS >/= 57%    Baseline DPS: 60.7 DFS: 44    Time 6    Period Weeks    Status New                   Plan - 03/22/21 2029     Clinical Impression Statement Continued progression of balance exercises, with patient tolerating well. Continue to demo most postural sway with head movemet and eyes closed. Patient progressing well with PT services, will continue with POC.    Personal Factors and Comorbidities Comorbidity 3+;Age    Comorbidities Arthritis, Dyspnea, GERD, HLD, History of PE, A-Fib    Examination-Activity Limitations Locomotion Level;Transfers;Bend;Stairs    Examination-Participation Restrictions Community Activity;Occupation;Yard Work;Driving    Stability/Clinical Decision Making Stable/Uncomplicated    Rehab Potential Good    PT Frequency 2x / week    PT Duration 6 weeks    PT Treatment/Interventions ADLs/Self Care Home Management;Aquatic Therapy;Canalith Repostioning;Electrical Stimulation;Moist Heat;Cryotherapy;DME Instruction;Gait training;Stair training;Functional mobility training;Therapeutic activities;Therapeutic exercise;Balance training;Neuromuscular re-education;Patient/family education;Manual techniques;Vestibular;Dry needling    PT Next Visit Plan Please check STG's #3 and 4 - Focus on balance with eyes closed, compliant surfaces, more visually complex environments. visual tracking    PT [place] [id number]    Consulted and Agree with Plan of Care Patient;Family member/caregiver    Family Member Consulted Wife Helene Kelp)             Patient will benefit from skilled therapeutic intervention in order to improve the following deficits and impairments:  Abnormal gait, Difficulty walking, Dizziness, Decreased activity tolerance, Decreased endurance, Decreased balance, Decreased strength  Visit Diagnosis: Dizziness and giddiness  Unsteadiness on feet  Other abnormalities of gait and mobility  Muscle weakness  (generalized)     Problem List Patient Active Problem List   Diagnosis Date Noted   History of retinal detachment 05/03/2020   History of vitrectomy 05/03/2020   Macular pucker, left eye 05/03/2020   Intermediate stage nonexudative age-related macular degeneration of both eyes 10/30/2019   Longstanding persistent atrial fibrillation ([place])    Paroxysmal atrial fibrillation ([place]) 10/12/2014    Jones Bales, PT, DPT 03/22/2021, 8:30 PM  [facility] [address] [place], [place], [address] Phone: [phone]   Fax:  [phone]  Name: Robert Hart MRN: [id number] Date of Birth: [birth year]

## 2021-03-22 NOTE — Patient Instructions (Signed)
Access Code: [email or web address] URL: [email or web address] Date: 03/22/2021 Prepared by: Baldomero Lamy  Exercises Standing Gaze Stabilization with Head Rotation - 2-3 x daily - 7 x weekly - 1 sets - 45 hold Standing Gaze Stabilization with Head Nod - 2-3 x daily - 7 x weekly - 1 sets - 45 second hold Standing Balance with Eyes Closed on Foam - 1 x daily - 7 x weekly - 1 sets - 3 reps - 30 seconds hold Half Tandem Stance Balance with Head Rotation - 1 x daily - 7 x weekly - 2 sets - 10 reps Walking with Head Rotation - 1 x daily - 7 x weekly - 1 sets - 3-4 reps Standing with Head Rotation and Eyes Closed on Pillow - 1 x daily - 7 x weekly - 2 sets - 10 reps

## 2021-03-24 ENCOUNTER — Ambulatory Visit: Payer: Medicare Other

## 2021-03-29 ENCOUNTER — Ambulatory Visit: Payer: Medicare Other

## 2021-03-31 ENCOUNTER — Ambulatory Visit: Payer: Medicare Other

## 2021-03-31 ENCOUNTER — Other Ambulatory Visit: Payer: Self-pay

## 2021-03-31 DIAGNOSIS — M6281 Muscle weakness (generalized): Secondary | ICD-10-CM

## 2021-03-31 DIAGNOSIS — R2681 Unsteadiness on feet: Secondary | ICD-10-CM | POA: Diagnosis not present

## 2021-03-31 DIAGNOSIS — R2689 Other abnormalities of gait and mobility: Secondary | ICD-10-CM

## 2021-03-31 DIAGNOSIS — R42 Dizziness and giddiness: Secondary | ICD-10-CM | POA: Diagnosis not present

## 2021-03-31 NOTE — Therapy (Signed)
[facility] [address] [place], [place], [address] Phone: [phone]   Fax:  [phone]  Physical Therapy Treatment/Re-Certification  Patient Details  Name: Robert Hart MRN: [id number] Date of Birth: 30-May-1950 Referring Provider (PT): Melida Quitter, MD   Encounter Date: 03/31/2021   PT End of Session - 03/31/21 1617     Visit Number 7    Number of Visits 12    Date for PT Re-Evaluation 04/29/21    Authorization Type Medicare + BCBS    Progress Note Due on Visit 10    PT Start Time 1617    PT Stop Time 1656    PT Time Calculation (min) 39 min    Equipment Utilized During Treatment Gait belt    Activity Tolerance Patient tolerated treatment well    Behavior During Therapy WFL for tasks assessed/performed             Past Medical History:  Diagnosis Date   Arthritis    Back pain    Dyspnea    ED (erectile dysfunction)    GERD (gastroesophageal reflux disease)    Hemorrhoids    Hyperlipidemia    Insomnia    Nocturia    PE (pulmonary embolism)    Rhinitis    Shoulder pain, bilateral    Urinary hesitancy     Past Surgical History:  Procedure Laterality Date   CARDIOVERSION N/A 11/16/2014   Procedure: CARDIOVERSION;  Surgeon: Sanda Klein, MD;  Location: [facility];  Service: Cardiovascular;  Laterality: N/A;   COLONOSCOPY     TONSILLECTOMY AND ADENOIDECTOMY     VASECTOMY  1989    There were no vitals filed for this visit.   Subjective Assessment - 03/31/21 1619     Subjective Has been sick, but is feeling much better. Patient reports overall doing well, started exercises back up once he felt better.    Patient is accompained by: Family member   [name]   Pertinent History Arthritis, Dyspnea, GERD, HLD, History of PE    Limitations Walking;Standing    Patient Stated Goals Get Rid of the Vertigo    Currently in Pain? No/denies                [facility] PT Assessment - 03/31/21 0001        Assessment   Medical Diagnosis Vertigo    Referring Provider (PT) Melida Quitter, MD      Observation/Other Assessments   Focus on Therapeutic Outcomes (FOTO)  DPS: 62 DFS: 54      High Level Balance   High Level Balance Comments Completed M-CTSIB: situation 1 -3 for full 30 seconds, situation 4 average: 23 seconds.      Functional Gait  Assessment   Gait assessed  Yes    Gait Level Surface Walks 20 ft in less than 7 sec but greater than 5.5 sec, uses assistive device, slower speed, mild gait deviations, or deviates 6-10 in outside of the 12 in walkway width.   5.8 seconds   Change in Gait Speed Able to smoothly change walking speed without loss of balance or gait deviation. Deviate no more than 6 in outside of the 12 in walkway width.    Gait with Horizontal Head Turns Performs head turns smoothly with slight change in gait velocity (eg, minor disruption to smooth gait path), deviates 6-10 in outside 12 in walkway width, or uses an assistive device.    Gait with Vertical Head Turns Performs task with slight  change in gait velocity (eg, minor disruption to smooth gait path), deviates 6 - 10 in outside 12 in walkway width or uses assistive device    Gait and Pivot Turn Pivot turns safely within 3 sec and stops quickly with no loss of balance.    Step Over Obstacle Is able to step over 2 stacked shoe boxes taped together (9 in total height) without changing gait speed. No evidence of imbalance.    Gait with Narrow Base of Support Is able to ambulate for 10 steps heel to toe with no staggering.    Gait with Eyes Closed Walks 20 ft, uses assistive device, slower speed, mild gait deviations, deviates 6-10 in outside 12 in walkway width. Ambulates 20 ft in less than 9 sec but greater than 7 sec.    Ambulating Backwards Walks 20 ft, no assistive devices, good speed, no evidence for imbalance, normal gait    Steps Alternating feet, no rail.    Total Score 26               Vestibular  Assessment - 03/31/21 0001       Visual Acuity   Static 10    Dynamic 7   mild unsteadiness              PT Education - 03/31/21 1908     Education Details progress toward LTGs; updated POC    Person(s) Educated Patient    Methods Explanation    Comprehension Verbalized understanding              PT Short Term Goals - 03/18/21 0917       PT SHORT TERM GOAL #1   Title Patient will be independent with initial vestibular/balance HEP (ALL STGS Due: 03/11/21)    Baseline no HEP established ; met 03-17-21    Time 3    Period Weeks    Status Achieved    Target Date 03/11/21      PT SHORT TERM GOAL #2   Title Patient will improve situation 2 of M-CTSIB to >/= 30 seconds to demo improved balance    Baseline 20 seconds; met 03-17-21 - 32 secs    Time 3    Period Weeks    Status Achieved      PT SHORT TERM GOAL #3   Title FGA TBA and LTG to be set; Increase FGA score from 19/30 to >/= 23/30 for reduced fall risk.    Baseline TBA    Time 3    Period Weeks    Status New      PT SHORT TERM GOAL #4   Title DVA TBA and LTG to be set; Improve DGA to </= 4 line difference    Baseline TBA    Time 3    Period Weeks    Status New               PT Long Term Goals - 03/31/21 1630       PT LONG TERM GOAL #1   Title Patient will be independent with final vestibular/balance HEP (All LTGS Due: 04/01/21)    Baseline no HEP established; reports independence will benefit from progressive HEP    Time 6    Period Weeks    Status Achieved      PT LONG TERM GOAL #2   Title LTG to be set for FGA    Baseline TBA; 26/30    Time 6    Period Weeks  Status On-going      PT LONG TERM GOAL #3   Title Patient will improve situation 4 of M-CTSIB to >/= 15 seconds to demo improved vestibular input for balance    Baseline <5 seconds; average 23 seconds    Time 6    Period Weeks    Status Achieved      PT LONG TERM GOAL #4   Title LTG to be set for DVA    Baseline TBA;  3 line differnce on 03/31/21    Time 6    Period Weeks    Status On-going      PT LONG TERM GOAL #5   Title Patient will improve DFS >/= 57%    Baseline DPS: 60.7 DFS: 44; 11/10: DFS: 54%    Time 6    Period Weeks    Status Partially Met            Updated Short Term Goals:   PT Short Term Goals - 03/31/21 1914       PT SHORT TERM GOAL #1   Title = LTGs              Updated Long Term Goals:   PT Long Term Goals - 03/31/21 1914       PT LONG TERM GOAL #1   Title Patient will be independent with progresive vestibular/balance HEP (All LTGS Due: 04/29/21)    Baseline no HEP established; reports independence will benefit from progressive HEP    Time 4    Period Weeks    Status Revised    Target Date 04/29/21      PT LONG TERM GOAL #2   Title Patient will improve FGA to >/= 27/30    Baseline TBA; 26/30    Time 4    Period Weeks    Status Revised      PT LONG TERM GOAL #3   Title Patient will improve situation 4 of M-CTSIB to >/= 25 seconds to demo improved vestibular input for balance    Baseline <5 seconds; average 23 seconds    Time 4    Period Weeks    Status Revised      PT LONG TERM GOAL #4   Title Patient will improve DVA to </= 2 line difference to indicative improved VOR    Baseline TBA; 3 line differnce on 03/31/21    Time 4    Period Weeks    Status Revised      PT LONG TERM GOAL #5   Title Patient will improve DFS >/= 57%    Baseline DPS: 60.7 DFS: 44; 11/10: DFS: 54%    Time 4    Period Weeks    Status On-going                  Plan - 03/31/21 1911     Clinical Impression Statement Completed assesment of patient's progress toward all LTGs. Patient demo significant improvements in balance with score of 26/30 on FGA, and improved vestibular input on situation 4 of M-CTSIB. Paitent improved DVA by 1 line difference, still indicating VOR Impairment. Paitent is making significant progress with PT services and will continue to benefit  from skilled PT services to address all unmet LTG and further improve balance and functional mobility.    Personal Factors and Comorbidities Comorbidity 3+;Age    Comorbidities Arthritis, Dyspnea, GERD, HLD, History of PE, A-Fib    Examination-Activity Limitations Locomotion Level;Transfers;Bend;Stairs    Examination-Participation Restrictions Community Activity;Occupation;[employer]  Work;Driving    Stability/Clinical Decision Making Stable/Uncomplicated    Rehab Potential Good    PT Frequency 1x / week    PT Duration 4 weeks    PT Treatment/Interventions ADLs/Self Care Home Management;Aquatic Therapy;Canalith Repostioning;Electrical Stimulation;Moist Heat;Cryotherapy;DME Instruction;Gait training;Stair training;Functional mobility training;Therapeutic activities;Therapeutic exercise;Balance training;Neuromuscular re-education;Patient/family education;Manual techniques;Vestibular;Dry needling    PT Next Visit Plan Complete entire review of HEP + Update. Focus on balance with eyes closed, compliant surfaces, more visually complex environments. visual tracking    PT [place] [id number]    Consulted and Agree with Plan of Care Patient;Family member/caregiver    Family Member Consulted Wife Helene Kelp)             Patient will benefit from skilled therapeutic intervention in order to improve the following deficits and impairments:  Abnormal gait, Difficulty walking, Dizziness, Decreased activity tolerance, Decreased endurance, Decreased balance, Decreased strength  Visit Diagnosis: Dizziness and giddiness  Unsteadiness on feet  Other abnormalities of gait and mobility  Muscle weakness (generalized)     Problem List Patient Active Problem List   Diagnosis Date Noted   History of retinal detachment 05/03/2020   History of vitrectomy 05/03/2020   Macular pucker, left eye 05/03/2020   Intermediate stage nonexudative age-related macular degeneration of both eyes 10/30/2019    Longstanding persistent atrial fibrillation ([place])    Paroxysmal atrial fibrillation ([place]) 10/12/2014    Jones Bales, PT, DPT 03/31/2021, 7:13 PM  [facility] [address] [place], [place], [address] Phone: [phone]   Fax:  [phone]  Name: Robert Hart MRN: [id number] Date of Birth: [birth year]

## 2021-04-05 ENCOUNTER — Ambulatory Visit: Payer: Medicare Other

## 2021-04-05 ENCOUNTER — Other Ambulatory Visit: Payer: Self-pay

## 2021-04-05 DIAGNOSIS — R2681 Unsteadiness on feet: Secondary | ICD-10-CM

## 2021-04-05 DIAGNOSIS — M6281 Muscle weakness (generalized): Secondary | ICD-10-CM

## 2021-04-05 DIAGNOSIS — R42 Dizziness and giddiness: Secondary | ICD-10-CM | POA: Diagnosis not present

## 2021-04-05 DIAGNOSIS — R2689 Other abnormalities of gait and mobility: Secondary | ICD-10-CM

## 2021-04-05 NOTE — Therapy (Signed)
[facility] [address] [place], [place], [address] Phone: [phone]   Fax:  [phone]  Physical Therapy Treatment  Patient Details  Name: Robert Hart MRN: [id number] Date of Birth: March 13, 1951 Referring Provider (PT): Melida Quitter, MD   Encounter Date: 04/05/2021   PT End of Session - 04/05/21 1703     Visit Number 8    Number of Visits 12    Date for PT Re-Evaluation 04/29/21    Authorization Type Medicare + BCBS    Progress Note Due on Visit 10    PT Start Time 1702    PT Stop Time 1743    PT Time Calculation (min) 41 min    Equipment Utilized During Treatment Gait belt    Activity Tolerance Patient tolerated treatment well    Behavior During Therapy WFL for tasks assessed/performed             Past Medical History:  Diagnosis Date   Arthritis    Back pain    Dyspnea    ED (erectile dysfunction)    GERD (gastroesophageal reflux disease)    Hemorrhoids    Hyperlipidemia    Insomnia    Nocturia    PE (pulmonary embolism)    Rhinitis    Shoulder pain, bilateral    Urinary hesitancy     Past Surgical History:  Procedure Laterality Date   CARDIOVERSION N/A 11/16/2014   Procedure: CARDIOVERSION;  Surgeon: Sanda Klein, MD;  Location: [facility];  Service: Cardiovascular;  Laterality: N/A;   COLONOSCOPY     TONSILLECTOMY AND ADENOIDECTOMY     VASECTOMY  1989    There were no vitals filed for this visit.   Subjective Assessment - 04/05/21 1704     Subjective Patient reports no new changes/complaints. Overall feeling better. No falls.    Patient is accompained by: Family member   Robert Hart   Pertinent History Arthritis, Dyspnea, GERD, HLD, History of PE    Limitations Walking;Standing    Patient Stated Goals Get Rid of the Vertigo    Currently in Pain? No/denies                Vestibular Treatment/Exercise - 04/05/21 0001       Vestibular Treatment/Exercise   Vestibular  Treatment Provided Gaze    Gaze Exercises X1 Viewing Horizontal;X1 Viewing Vertical      X1 Viewing Horizontal   Foot Position standing feet apart airex, plain background; glasses removed    Reps 1    Comments x 60 secs      X1 Viewing Vertical   Foot Position standing feet apart airex, plain background; glasses removed    Reps 1    Comments x 60 secs             Gaze Stabilization: Standing Feet Apart (Compliant Surface)    Feet apart on pillow, keeping eyes on target on wall 3-4 feet away, tilt head down 15-30 and move head side to side for 60 seconds. Repeat while moving head up and down for 60 seconds. Do 2-3 sessions per day.  Copyright  VHI. All rights reserved.    Completed all of the following balance exercises during session to review and progress HEP. Bolded are progressions included in today's skilled PT session.  Access Code: [email or web address] URL: [email or web address] Date: 04/05/2021 Prepared by: Baldomero Lamy  Exercises Standing Balance with Eyes Closed on Foam - 1 x daily - 7 x weekly - 1 sets - 3 reps -  30 seconds hold Tandem Stance on Foam Pad with Eyes Open - 1 x daily - 7 x weekly - 1 sets - 3 reps - 15-20 seconds hold Half Tandem Stance Balance with Head Rotation - 1 x daily - 7 x weekly - 2 sets - 10 reps Wide Stance with Eyes Closed and Head Nods on Foam Pad - 1 x daily - 7 x weekly - 2 sets - 10 reps Wide Stance with Eyes Closed and Head Rotation on Foam Pad - 1 x daily - 7 x weekly - 2 sets - 10 reps Walking with Head Rotation - 1 x daily - 7 x weekly - 1 sets - 3-4 reps Tandem Walking - 1 x daily - 7 x weekly - 1 sets - 3-4 reps       PT Education - 04/05/21 1744     Education Details HEP Update    Person(s) Educated Patient    Methods Explanation;Demonstration;Handout    Comprehension Verbalized understanding;Returned demonstration              PT Short Term Goals - 03/31/21 1914       PT SHORT TERM GOAL #1   Title =  LTGs               PT Long Term Goals - 03/31/21 1914       PT LONG TERM GOAL #1   Title Patient will be independent with progresive vestibular/balance HEP (All LTGS Due: 04/29/21)    Baseline no HEP established; reports independence will benefit from progressive HEP    Time 4    Period Weeks    Status Revised    Target Date 04/29/21      PT LONG TERM GOAL #2   Title Patient will improve FGA to >/= 27/30    Baseline TBA; 26/30    Time 4    Period Weeks    Status Revised      PT LONG TERM GOAL #3   Title Patient will improve situation 4 of M-CTSIB to >/= 25 seconds to demo improved vestibular input for balance    Baseline <5 seconds; average 23 seconds    Time 4    Period Weeks    Status Revised      PT LONG TERM GOAL #4   Title Patient will improve DVA to </= 2 line difference to indicative improved VOR    Baseline TBA; 3 line differnce on 03/31/21    Time 4    Period Weeks    Status Revised      PT LONG TERM GOAL #5   Title Patient will improve DFS >/= 57%    Baseline DPS: 60.7 DFS: 44; 11/10: DFS: 54%    Time 4    Period Weeks    Status On-going                   Plan - 04/05/21 1744     Clinical Impression Statement Today's skilled PT session focused on completed full review of HEP and progressing to patient's tolerance. Patient able to tolerate progression of VOR and balance activities with no dizziness reported. Will continue to progress toward all LTGs.    Personal Factors and Comorbidities Comorbidity 3+;Age    Comorbidities Arthritis, Dyspnea, GERD, HLD, History of PE, A-Fib    Examination-Activity Limitations Locomotion Level;Transfers;Bend;Stairs    Examination-Participation Restrictions Community Activity;Occupation;Yard Work;Driving    Stability/Clinical Decision Making Stable/Uncomplicated    Rehab Potential Good  PT Frequency 1x / week    PT Duration 4 weeks    PT Treatment/Interventions ADLs/Self Care Home Management;Aquatic  Therapy;Canalith Repostioning;Electrical Stimulation;Moist Heat;Cryotherapy;DME Instruction;Gait training;Stair training;Functional mobility training;Therapeutic activities;Therapeutic exercise;Balance training;Neuromuscular re-education;Patient/family education;Manual techniques;Vestibular;Dry needling    PT Next Visit Plan How was HEP Update? Focus on balance with eyes closed, compliant surfaces, more visually complex environments. visual tracking    PT [place] [email or web address]    Consulted and Agree with Plan of Care Patient;Family member/caregiver    Family Member Consulted Wife Helene Kelp)             Patient will benefit from skilled therapeutic intervention in order to improve the following deficits and impairments:  Abnormal gait, Difficulty walking, Dizziness, Decreased activity tolerance, Decreased endurance, Decreased balance, Decreased strength  Visit Diagnosis: Dizziness and giddiness  Unsteadiness on feet  Other abnormalities of gait and mobility  Muscle weakness (generalized)     Problem List Patient Active Problem List   Diagnosis Date Noted   History of retinal detachment 05/03/2020   History of vitrectomy 05/03/2020   Macular pucker, left eye 05/03/2020   Intermediate stage nonexudative age-related macular degeneration of both eyes 10/30/2019   Longstanding persistent atrial fibrillation ([place])    Paroxysmal atrial fibrillation ([place]) 10/12/2014    Jones Bales, PT, DPT 04/05/2021, 6:24 PM  [facility] [address] [place], [place], [address] Phone: [phone]   Fax:  [phone]  Name: CLELAND SIMKINS MRN: [id number] Date of Birth: 1951-02-09

## 2021-04-05 NOTE — Patient Instructions (Addendum)
Gaze Stabilization: Standing Feet Apart (Compliant Surface)    Feet apart on pillow, keeping eyes on target on wall 3-4 feet away, tilt head down 15-30 and move head side to side for 60 seconds. Repeat while moving head up and down for 60 seconds. Do 2-3 sessions per day.  Copyright  VHI. All rights reserved.   Access Code: [email or web address] URL: [email or web address] Date: 04/05/2021 Prepared by: Baldomero Lamy  Exercises Standing Balance with Eyes Closed on Foam - 1 x daily - 7 x weekly - 1 sets - 3 reps - 30 seconds hold Tandem Stance on Foam Pad with Eyes Open - 1 x daily - 7 x weekly - 1 sets - 3 reps - 15-20 seconds hold Half Tandem Stance Balance with Head Rotation - 1 x daily - 7 x weekly - 2 sets - 10 reps Wide Stance with Eyes Closed and Head Nods on Foam Pad - 1 x daily - 7 x weekly - 2 sets - 10 reps Wide Stance with Eyes Closed and Head Rotation on Foam Pad - 1 x daily - 7 x weekly - 2 sets - 10 reps Walking with Head Rotation - 1 x daily - 7 x weekly - 1 sets - 3-4 reps Tandem Walking - 1 x daily - 7 x weekly - 1 sets - 3-4 reps

## 2021-04-12 ENCOUNTER — Other Ambulatory Visit: Payer: Self-pay

## 2021-04-12 ENCOUNTER — Ambulatory Visit: Payer: Medicare Other

## 2021-04-12 DIAGNOSIS — R2689 Other abnormalities of gait and mobility: Secondary | ICD-10-CM | POA: Diagnosis not present

## 2021-04-12 DIAGNOSIS — R42 Dizziness and giddiness: Secondary | ICD-10-CM

## 2021-04-12 DIAGNOSIS — M6281 Muscle weakness (generalized): Secondary | ICD-10-CM | POA: Diagnosis not present

## 2021-04-12 DIAGNOSIS — R2681 Unsteadiness on feet: Secondary | ICD-10-CM | POA: Diagnosis not present

## 2021-04-12 NOTE — Therapy (Signed)
[facility] [address] [place], [place], [address] Phone: [phone]   Fax:  [phone]  Physical Therapy Treatment  Patient Details  Name: Robert Hart MRN: [id number] Date of Birth: 1950-11-16 Referring Provider (PT): Melida Quitter, MD   Encounter Date: 04/12/2021   PT End of Session - 04/12/21 1017     Visit Number 9    Number of Visits 12    Date for PT Re-Evaluation 04/29/21    Authorization Type Medicare + BCBS    Progress Note Due on Visit 10    PT Start Time 1016    PT Stop Time 1100    PT Time Calculation (min) 44 min    Equipment Utilized During Treatment Gait belt    Activity Tolerance Patient tolerated treatment well    Behavior During Therapy WFL for tasks assessed/performed             Past Medical History:  Diagnosis Date   Arthritis    Back pain    Dyspnea    ED (erectile dysfunction)    GERD (gastroesophageal reflux disease)    Hemorrhoids    Hyperlipidemia    Insomnia    Nocturia    PE (pulmonary embolism)    Rhinitis    Shoulder pain, bilateral    Urinary hesitancy     Past Surgical History:  Procedure Laterality Date   CARDIOVERSION N/A 11/16/2014   Procedure: CARDIOVERSION;  Surgeon: Sanda Klein, MD;  Location: [facility];  Service: Cardiovascular;  Laterality: N/A;   COLONOSCOPY     TONSILLECTOMY AND ADENOIDECTOMY     VASECTOMY  1989    There were no vitals filed for this visit.   Subjective Assessment - 04/12/21 1018     Subjective No new changes/complaints. Reports new exercises are going well, tandem is still a challenge.    Patient is accompained by: Family member   [name]   Pertinent History Arthritis, Dyspnea, GERD, HLD, History of PE    Limitations Walking;Standing    Patient Stated Goals Get Rid of the Vertigo    Currently in Pain? No/denies               [facility] Adult PT Treatment/Exercise - 04/12/21 0001       Ambulation/Gait    Ambulation/Gait Yes    Ambulation/Gait Assistance 5: Supervision    Ambulation/Gait Assistance Details throughout therapy with activities    Assistive device None    Gait Pattern Wide base of support    Ambulation Surface Level;Indoor      High Level Balance   High Level Balance Activities Tandem walking;Negotiating over obstacles    High Level Balance Comments completed tandem walking forwards on blue mat followe by negotiating over obstacles (cones) with toe tap prior to step over to promote SLS, completed x 4 laps down and back. CGA intermittent.      Therapeutic Activites    Therapeutic Activities Other Therapeutic Activities    Other Therapeutic Activities Completed VOR x 1 forward with ambulation with vertical x 4 reps forward and then followed by backward, then completed horizontal VOR x 4 reps forward. more challenge noted with horizontal > vertical.             Vestibular Treatment/Exercise - 04/12/21 0001       Vestibular Treatment/Exercise   Vestibular Treatment Provided Gaze    Gaze Exercises X1 Viewing Horizontal;X1 Viewing Vertical      X1 Viewing Horizontal   Foot Position standing  narrow BOS on airex, plain background; glasses removed    Reps 2    Comments x 60 seconds, postural sway noted      X1 Viewing Vertical   Foot Position standing narrow BOS on airex, plain background; glasses removed    Reps 2    Comments x 60 secs, postural swau                Balance Exercises - 04/12/21 0001       Balance Exercises: Standing   Marching Head turns;Static;Foam/compliant surface;Intermittent upper extremity assist;Limitations    Marching Limitations standing on airex completed alternating marching with EO x 60 seconds, then progressed to EO (smaller marching) with addition of horizontal/vertical head turns 2 x 10 reps each direction.    Other Standing Exercises completed steps over floor pebbles/obstacle course x 5 laps down and back, progressing distance  between floor pebble to promote balance/SLS. Intermittent CGA throughout. cues for slow/control pace            Gaze Stabilization: Walking Toward Target  Keeping eyes on target, walk toward target on wall 10-12 feet away, moving head up and down fas you walk toward target, then continue as you walk away from target x 5 reps. Repeat with head tilted down 15-30, moving head side to side as you walk toward the target. Do 1 sessions per day.  Copyright  VHI. All rights reserved.     PT Education - 04/12/21 1100     Education Details HEP update    Person(s) Educated Patient    Methods Explanation;Handout;Demonstration    Comprehension Verbalized understanding;Returned demonstration              PT Short Term Goals - 03/31/21 1914       PT SHORT TERM GOAL #1   Title = LTGs               PT Long Term Goals - 03/31/21 1914       PT LONG TERM GOAL #1   Title Patient will be independent with progresive vestibular/balance HEP (All LTGS Due: 04/29/21)    Baseline no HEP established; reports independence will benefit from progressive HEP    Time 4    Period Weeks    Status Revised    Target Date 04/29/21      PT LONG TERM GOAL #2   Title Patient will improve FGA to >/= 27/30    Baseline TBA; 26/30    Time 4    Period Weeks    Status Revised      PT LONG TERM GOAL #3   Title Patient will improve situation 4 of M-CTSIB to >/= 25 seconds to demo improved vestibular input for balance    Baseline <5 seconds; average 23 seconds    Time 4    Period Weeks    Status Revised      PT LONG TERM GOAL #4   Title Patient will improve DVA to </= 2 line difference to indicative improved VOR    Baseline TBA; 3 line differnce on 03/31/21    Time 4    Period Weeks    Status Revised      PT LONG TERM GOAL #5   Title Patient will improve DFS >/= 57%    Baseline DPS: 60.7 DFS: 44; 11/10: DFS: 54%    Time 4    Period Weeks    Status On-going  Plan - 04/12/21 1146     Clinical Impression Statement Continued progression of VOR with more narrow BOS on airex, as well with ambulation. More notable challenge with horizontal VOR with forward ambulation, with intermittent CGA. Continued high level balacne working on [employer] and marching activities. patient tolerating well, will continue per POC.    Personal Factors and Comorbidities Comorbidity 3+;Age    Comorbidities Arthritis, Dyspnea, GERD, HLD, History of PE, A-Fib    Examination-Activity Limitations Locomotion Level;Transfers;Bend;Stairs    Examination-Participation Restrictions Community Activity;Occupation;Yard Work;Driving    Stability/Clinical Decision Making Stable/Uncomplicated    Rehab Potential Good    PT Frequency 1x / week    PT Duration 4 weeks    PT Treatment/Interventions ADLs/Self Care Home Management;Aquatic Therapy;Canalith Repostioning;Electrical Stimulation;Moist Heat;Cryotherapy;DME Instruction;Gait training;Stair training;Functional mobility training;Therapeutic activities;Therapeutic exercise;Balance training;Neuromuscular re-education;Patient/family education;Manual techniques;Vestibular;Dry needling    PT Next Visit Plan marching. VOR with ambulation Focus on balance with eyes closed, compliant surfaces, more visually complex environments. visual tracking    PT [place] [id number]    Consulted and Agree with Plan of Care Patient;Family member/caregiver    Family Member Consulted Wife Helene Kelp)             Patient will benefit from skilled therapeutic intervention in order to improve the following deficits and impairments:  Abnormal gait, Difficulty walking, Dizziness, Decreased activity tolerance, Decreased endurance, Decreased balance, Decreased strength  Visit Diagnosis: Dizziness and giddiness  Unsteadiness on feet  Other abnormalities of gait and mobility  Muscle weakness (generalized)     Problem List Patient Active  Problem List   Diagnosis Date Noted   History of retinal detachment 05/03/2020   History of vitrectomy 05/03/2020   Macular pucker, left eye 05/03/2020   Intermediate stage nonexudative age-related macular degeneration of both eyes 10/30/2019   Longstanding persistent atrial fibrillation ([place])    Paroxysmal atrial fibrillation ([place]) 10/12/2014    Jones Bales, PT, DPT 04/12/2021, 11:49 AM  [facility] [address] [place], [place], [address] Phone: [phone]   Fax:  [phone]  Name: Robert Hart MRN: [id number] Date of Birth: [birth year]

## 2021-04-12 NOTE — Patient Instructions (Signed)
Gaze Stabilization: Walking Toward Target    Keeping eyes on target, walk toward target on wall 10-12 feet away, moving head up and down fas you walk toward target, then continue as you walk away from target x 5 reps. Repeat with head tilted down 15-30, moving head side to side as you walk toward the target. Do 1 sessions per day.  Copyright  VHI. All rights reserved.

## 2021-04-18 DIAGNOSIS — Z85828 Personal history of other malignant neoplasm of skin: Secondary | ICD-10-CM | POA: Diagnosis not present

## 2021-04-18 DIAGNOSIS — L820 Inflamed seborrheic keratosis: Secondary | ICD-10-CM | POA: Diagnosis not present

## 2021-04-19 DIAGNOSIS — H903 Sensorineural hearing loss, bilateral: Secondary | ICD-10-CM | POA: Diagnosis not present

## 2021-04-19 DIAGNOSIS — Z974 Presence of external hearing-aid: Secondary | ICD-10-CM | POA: Diagnosis not present

## 2021-04-21 ENCOUNTER — Ambulatory Visit: Payer: Medicare Other

## 2021-04-26 ENCOUNTER — Other Ambulatory Visit: Payer: Self-pay

## 2021-04-26 ENCOUNTER — Ambulatory Visit: Payer: Medicare Other | Attending: Otolaryngology | Admitting: Physical Therapy

## 2021-04-26 DIAGNOSIS — M6281 Muscle weakness (generalized): Secondary | ICD-10-CM | POA: Insufficient documentation

## 2021-04-26 DIAGNOSIS — R42 Dizziness and giddiness: Secondary | ICD-10-CM | POA: Insufficient documentation

## 2021-04-26 DIAGNOSIS — R2689 Other abnormalities of gait and mobility: Secondary | ICD-10-CM | POA: Insufficient documentation

## 2021-04-26 DIAGNOSIS — R2681 Unsteadiness on feet: Secondary | ICD-10-CM | POA: Insufficient documentation

## 2021-04-27 NOTE — Therapy (Signed)
[facility] [address] [place], [place], [address] Phone: [phone]   Fax:  [phone]  Physical Therapy Treatment; 10th Visit Progress Note and Recertification  Patient Details  Name: Robert Hart MRN: [id number] Date of Birth: 03/04/1951 Referring Provider (PT): Melida Quitter, MD   Encounter Date: 04/26/2021  Progress Note Reporting Period 02/15/2021 to 04/26/2021  See note below for Objective Data and Assessment of Progress/Goals.     PT End of Session - 04/26/21 1345     Visit Number 10    Number of Visits 18    Date for PT Re-Evaluation 06/25/21    Authorization Type Medicare + BCBS    Progress Note Due on Visit 39    PT Start Time 0851    PT Stop Time 0936    PT Time Calculation (min) 45 min    Activity Tolerance Patient tolerated treatment well    Behavior During Therapy WFL for tasks assessed/performed             Past Medical History:  Diagnosis Date   Arthritis    Back pain    Dyspnea    ED (erectile dysfunction)    GERD (gastroesophageal reflux disease)    Hemorrhoids    Hyperlipidemia    Insomnia    Nocturia    PE (pulmonary embolism)    Rhinitis    Shoulder pain, bilateral    Urinary hesitancy     Past Surgical History:  Procedure Laterality Date   CARDIOVERSION N/A 11/16/2014   Procedure: CARDIOVERSION;  Surgeon: Sanda Klein, MD;  Location: [facility];  Service: Cardiovascular;  Laterality: N/A;   COLONOSCOPY     TONSILLECTOMY AND ADENOIDECTOMY     VASECTOMY  1989    There were no vitals filed for this visit.   Subjective Assessment - 04/26/21 0858     Subjective Was disruptive to move appointments from 5pm to morning.  Had appointment with audiology who is recomming cochlear implants; has another appointment on the 22nd at the balance center.  Exercises are going well and feels like the exericses/therapy are helping.    Patient is accompained by: Family member    [name]   Pertinent History Arthritis, Dyspnea, GERD, HLD, History of PE    Limitations Walking;Standing    Patient Stated Goals Get Rid of the Vertigo    Currently in Pain? No/denies                [facility] PT Assessment - 04/26/21 0901       Assessment   Medical Diagnosis Vertigo    Referring Provider (PT) Melida Quitter, MD    Onset Date/Surgical Date 02/07/21    Prior Therapy No PT Prior for Vertigo      Precautions   Precautions Other (comment)    Precaution Comments Arthritis, Dyspnea, GERD, HLD, History of PE, A-Fib      Balance Screen   Has the patient fallen in the past 6 months No      Prior Function   Level of Independence Independent    Vocation Full time employment    Vocation Requirements [employer]; Office Work      Observation/Other Assessments   Focus on Therapeutic Outcomes (FOTO)  DPS: 58; DFS: 55; no significant change      High Level Balance   High Level Balance Comments MCTSIB: 30 seconds for all conditions; condition 4 required close supervision with tendency to lean/fall to L      Functional  Gait  Assessment   Gait assessed  Yes    Gait Level Surface Walks 20 ft in less than 5.5 sec, no assistive devices, good speed, no evidence for imbalance, normal gait pattern, deviates no more than 6 in outside of the 12 in walkway width.    Change in Gait Speed Able to smoothly change walking speed without loss of balance or gait deviation. Deviate no more than 6 in outside of the 12 in walkway width.    Gait with Horizontal Head Turns Performs head turns smoothly with slight change in gait velocity (eg, minor disruption to smooth gait path), deviates 6-10 in outside 12 in walkway width, or uses an assistive device.    Gait with Vertical Head Turns Performs task with slight change in gait velocity (eg, minor disruption to smooth gait path), deviates 6 - 10 in outside 12 in walkway width or uses assistive device    Gait and Pivot Turn Pivot turns safely  within 3 sec and stops quickly with no loss of balance.    Step Over Obstacle Is able to step over 2 stacked shoe boxes taped together (9 in total height) without changing gait speed. No evidence of imbalance.    Gait with Narrow Base of Support Ambulates 4-7 steps.    Gait with Eyes Closed Walks 20 ft, no assistive devices, good speed, no evidence of imbalance, normal gait pattern, deviates no more than 6 in outside 12 in walkway width. Ambulates 20 ft in less than 7 sec.    Ambulating Backwards Walks 20 ft, no assistive devices, good speed, no evidence for imbalance, normal gait    Steps Alternating feet, no rail.    Total Score 26    FGA comment: 26/30; difficulty with tandem and gait with head movements.                 Vestibular Assessment - 04/26/21 0918       Visual Acuity   Static 8    Dynamic 4   4 line difference             PT Education - 04/26/21 1344     Education Details progress towards goals, plan to recertify and areas to continue to address    Person(s) Educated Patient    Methods Explanation    Comprehension Verbalized understanding              PT Short Term Goals - 03/31/21 1914       PT SHORT TERM GOAL #1   Title = LTGs               PT Long Term Goals - 04/26/21 1346       PT LONG TERM GOAL #1   Title Patient will be independent with progresive vestibular/balance HEP    Baseline independent with current HEP; needs to be updated based on progress    Time 4    Period Weeks    Status Achieved    Target Date 04/29/21      PT LONG TERM GOAL #2   Title Patient will improve FGA to >/= 27/30    Baseline 26/30; no change    Time 4    Period Weeks    Status Not Met      PT LONG TERM GOAL #3   Title Patient will improve situation 4 of M-CTSIB to >/= 25 seconds to demo improved vestibular input for balance    Baseline 30 seconds all 4 conditions  Time 4    Period Weeks    Status Achieved      PT LONG TERM GOAL #4   Title  Patient will improve DVA to </= 2 line difference to indicative improved VOR    Baseline increased to 4 line difference today (8, 4)    Time 4    Period Weeks    Status Not Met      PT LONG TERM GOAL #5   Title Patient will improve DFS >/= 57%    Baseline 58 DFS; 55 DPS    Time 4    Period Weeks    Status Achieved             New goals for recertification:  PT Long Term Goals - 04/27/21 1018       PT LONG TERM GOAL #1   Title Patient will be independent with progressed vestibular/balance HEP (LTG Set at 4 weeks 05/27/21 and then reset again for 8 week POC 06/26/21)    Baseline independent with current HEP; needs to be updated based on progress    Time 4    Period Weeks    Status Revised    Target Date 05/27/21      PT LONG TERM GOAL #2   Title Patient will improve FGA to >/= 28/30    Baseline 26/30; no change    Time 4    Period Weeks    Status Revised    Target Date 05/27/21      PT LONG TERM GOAL #3   Title Patient will improve situation 4 of M-CTSIB to >/= 25 seconds to demo improved vestibular input for balance    Baseline 30 seconds all 4 conditions    Status Achieved      PT LONG TERM GOAL #4   Title Patient will improve DVA to </= 2 line difference to indicative improved VOR    Baseline increased to 4 line difference today (8, 4)    Time 4    Period Weeks    Status Revised    Target Date 05/27/21      PT LONG TERM GOAL #5   Title Patient will improve DFS >/= 60% and DPS to >/= 60    Baseline 58 DFS; 55 DPS    Time 4    Period Weeks    Status Revised    Target Date 05/27/21                  Plan - 04/26/21 1348     Clinical Impression Statement Pt is making steady progress towards goals and has met 3/5 LTG.  Pt is independent with current HEP and demonstrates improvement in static standing balance and sensory integration but continues to demonstrate impaired VOR gain, dynamic gaze stabilization and dynamic standing balance when having to  perform head movements.  Pt did not meet DVA goal or FGA goal.  Pt met FOTO Dizziness functional Scale goal but Positional scale score remains unchanged.  Pt will benefit from continued skilled PT services to to address ongoing impairments to maximize functional mobility independence and decrease falls risk.    Personal Factors and Comorbidities Comorbidity 3+;Age    Comorbidities Arthritis, Dyspnea, GERD, HLD, History of PE, A-Fib    Examination-Activity Limitations Locomotion Level;Transfers;Bend;Stairs    Examination-Participation Restrictions Community Activity;Occupation;Yard Work;Driving    Stability/Clinical Decision Making --    Rehab Potential Good    PT Frequency 1x / week    PT Duration 8 weeks  PT Treatment/Interventions ADLs/Self Care Home Management;Aquatic Therapy;Canalith Repostioning;Electrical Stimulation;Moist Heat;Cryotherapy;DME Instruction;Gait training;Stair training;Functional mobility training;Therapeutic activities;Therapeutic exercise;Balance training;Neuromuscular re-education;Patient/family education;Vestibular    PT Next Visit Plan Please update/progress HEP; continue to work on dynamic balance and head turns; marching. VOR with ambulation Focus on balance with eyes closed, compliant surfaces, more visually complex environments. visual tracking    PT Home Exercise Plan [employer] [id number]    Consulted and Agree with Plan of Care Patient    Family Member Consulted --             Patient will benefit from skilled therapeutic intervention in order to improve the following deficits and impairments:  Abnormal gait, Difficulty walking, Dizziness, Decreased activity tolerance, Decreased endurance, Decreased balance, Decreased strength  Visit Diagnosis: Dizziness and giddiness  Unsteadiness on feet  Other abnormalities of gait and mobility  Muscle weakness (generalized)     Problem List Patient Active Problem List   Diagnosis Date Noted   History of  retinal detachment 05/03/2020   History of vitrectomy 05/03/2020   Macular pucker, left eye 05/03/2020   Intermediate stage nonexudative age-related macular degeneration of both eyes 10/30/2019   Longstanding persistent atrial fibrillation ([place])    Paroxysmal atrial fibrillation ([place]) 10/12/2014   Rico Junker, PT, DPT 04/27/21    10:21 AM    [facility] [address] [place], [place], [address] Phone: [phone]   Fax:  [phone]  Name: TYRA MICHELLE MRN: [id number] Date of Birth: 1951-04-03

## 2021-04-29 ENCOUNTER — Other Ambulatory Visit (HOSPITAL_COMMUNITY): Payer: Self-pay | Admitting: Nurse Practitioner

## 2021-05-02 ENCOUNTER — Encounter (INDEPENDENT_AMBULATORY_CARE_PROVIDER_SITE_OTHER): Payer: Self-pay | Admitting: Ophthalmology

## 2021-05-02 ENCOUNTER — Other Ambulatory Visit: Payer: Self-pay

## 2021-05-02 ENCOUNTER — Ambulatory Visit (INDEPENDENT_AMBULATORY_CARE_PROVIDER_SITE_OTHER): Payer: Medicare Other | Admitting: Ophthalmology

## 2021-05-02 DIAGNOSIS — H353132 Nonexudative age-related macular degeneration, bilateral, intermediate dry stage: Secondary | ICD-10-CM

## 2021-05-02 DIAGNOSIS — Z8669 Personal history of other diseases of the nervous system and sense organs: Secondary | ICD-10-CM | POA: Diagnosis not present

## 2021-05-02 DIAGNOSIS — H35372 Puckering of macula, left eye: Secondary | ICD-10-CM

## 2021-05-02 NOTE — Assessment & Plan Note (Signed)
History of scleral buckle OD in the past followed thereafter by vitrectomy membrane peel OD for epiretinal membrane  OS status post repair primary detachment via vitrectomy endolaser

## 2021-05-02 NOTE — Progress Notes (Signed)
05/02/2021     CHIEF COMPLAINT Patient presents for  Chief Complaint  Patient presents with   Retina Follow Up      HISTORY OF PRESENT ILLNESS: Robert Hart is a 70 y.o. male who presents to the clinic today for:   HPI     Retina Follow Up           Diagnosis: Dry AMD   Laterality: both eyes   Onset: 1 year ago   Severity: mild   Duration: 1 year   Course: stable         Comments   1 year fu ou and oct and fp Pt states., "I feel like I can see better now that I had that laser on my left eye to open that capsule up after cataract surgery." Pt states VA OU stable since last visit. Pt denies FOL, floaters, or ocular pain OU.  Pt reports using Ketorolac BID OD       Last edited by Kendra Opitz, COA on 05/02/2021  8:19 AM.      Referring physician: Leanna Battles, MD [place],  [place] [id number]  HISTORICAL INFORMATION:   Selected notes from the MEDICAL RECORD NUMBER       CURRENT MEDICATIONS: Current Outpatient Medications (Ophthalmic Drugs)  Medication Sig   ketorolac (ACULAR) 0.5 % ophthalmic solution INSTILL 1 DROP INTO THE RIGHT EYE FOUR TIMES A DAY   No current facility-administered medications for this visit. (Ophthalmic Drugs)   Current Outpatient Medications (Other)  Medication Sig   ELIQUIS 5 MG TABS tablet Take 1 tablet (5 mg total) by mouth 2 (two) times daily.   flecainide (TAMBOCOR) 100 MG tablet Take 1 tablet (100 mg total) by mouth 2 (two) times daily.   metoprolol succinate (TOPROL-XL) 25 MG 24 hr tablet TAKE 1/2 TABLET BY MOUTH DAILY appt needed for refill [id number]   Multiple Vitamins-Minerals (MULTIVITAMIN & MINERAL PO) Take 1 tablet by mouth daily.   pravastatin (PRAVACHOL) 40 MG tablet Take 1 tablet (40 mg total) by mouth daily.   traMADol (ULTRAM) 50 MG tablet Take 50 mg by mouth every 6 (six) hours as needed for moderate pain or severe pain.    traZODone (DESYREL) 100 MG tablet Take 100 mg by mouth at  bedtime.    No current facility-administered medications for this visit. (Other)      REVIEW OF SYSTEMS:    ALLERGIES No Known Allergies  PAST MEDICAL HISTORY Past Medical History:  Diagnosis Date   Arthritis    Back pain    Dyspnea    ED (erectile dysfunction)    GERD (gastroesophageal reflux disease)    Hemorrhoids    Hyperlipidemia    Insomnia    Nocturia    PE (pulmonary embolism)    Rhinitis    Shoulder pain, bilateral    Urinary hesitancy    Past Surgical History:  Procedure Laterality Date   CARDIOVERSION N/A 11/16/2014   Procedure: CARDIOVERSION;  Surgeon: Sanda Klein, MD;  Location: [facility];  Service: Cardiovascular;  Laterality: N/A;   COLONOSCOPY     TONSILLECTOMY AND ADENOIDECTOMY     VASECTOMY  1989    FAMILY HISTORY Family History  Problem Relation Age of Onset   Breast cancer Mother    Prostate cancer Father    COPD Father    Lung disease Father     SOCIAL HISTORY Social History   Tobacco Use   Smoking status: Never   Smokeless tobacco:  Never  Substance Use Topics   Alcohol use: Yes    Alcohol/week: 1.0 - 2.0 standard drink    Types: 1 - 2 Standard drinks or equivalent per week   Drug use: Never         OPHTHALMIC EXAM:  Base Eye Exam     Visual Acuity (Snellen - Linear)       Right Left   Dist cc 20/30 -1 20/20 -2   Dist ph cc NI          Tonometry (Tonopen, 8:23 AM)       Right Left   Pressure 18 16         Pupils       Pupils Shape React APD   Right PERRL Round Brisk None   Left PERRL Round Brisk None         Visual Fields (Counting fingers)       Left Right    Full Full         Extraocular Movement       Right Left    Full, Ortho Full, Ortho         Neuro/Psych     Oriented x3: Yes   Mood/Affect: Normal         Dilation     Both eyes: 1.0% Mydriacyl, 2.5% Phenylephrine @ 8:23 AM           Slit Lamp and Fundus Exam     External Exam       Right Left   External  Normal Normal         Slit Lamp Exam       Right Left   Lids/Lashes Normal Normal   Conjunctiva/Sclera White and quiet White and quiet   Cornea Clear Clear   Anterior Chamber Deep and quiet Deep and quiet   Iris Round and reactive Round and reactive   Lens Posterior chamber intraocular lens Posterior chamber intraocular lens   Anterior Vitreous Normal Normal         Fundus Exam       Right Left   Posterior Vitreous Normal, vitrectomized Normal, vitrectomized   Disc Normal Normal   C/D Ratio 0.05 0.0   Macula Intermediate age related macular degeneration, Soft drusen subfoveal some confluence, no hemorrhage, no macular thickening Intermediate age related macular degeneration, Soft drusen subfoveal some confluent, no hemorrhage, no macular thickening, mild topographic distortion nasally   Vessels Normal Normal   Periphery Normal,, no retinal breaks.  Good scleral buckle Normal,, no new breaks            IMAGING AND PROCEDURES  Imaging and Procedures for 05/02/21  Color Fundus Photography Optos - OU - Both Eyes       Right Eye Progression has been stable. Disc findings include normal observations. Macula : drusen, retinal pigment epithelium abnormalities. Vessels : normal observations.   Left Eye Progression has been stable. Disc findings include normal observations. Macula : retinal pigment epithelium abnormalities, drusen. Vessels : normal observations.   Notes History of scleral buckle OD, good cryopexy superotemporal, no new breaks no new tears retina detached.   History of retinal detachment OS, status post vitrectomy, retinal detachment.  No new findings, with intermediate ARMD OU with large soft nearly confluent drusen subfoveal      OCT, Retina - OU - Both Eyes       Right Eye Quality was good. Scan locations included subfoveal. Central Foveal Thickness: 421. Progression has been stable. Findings  include abnormal foveal contour, retinal drusen , no  IRF, no SRF.   Left Eye Scan locations included subfoveal. Central Foveal Thickness: 392. Progression has worsened. Findings include retinal drusen , no SRF, no IRF, epiretinal membrane.   Notes No signs of complications of ARMD OU, observe  OS, minor epiretinal membrane nasal to the fovea observe with slight increase in thickening overall with continued increased deposition of large soft drusen subfoveal location as compared to 7 years previous             ASSESSMENT/PLAN:  History of retinal detachment History of scleral buckle OD in the past followed thereafter by vitrectomy membrane peel OD for epiretinal membrane  OS status post repair primary detachment via vitrectomy endolaser  Macular pucker, left eye Mild to moderate epiretinal membrane has progressed over the last 12 months however no impact on acuity will continue to observe  Intermediate stage nonexudative age-related macular degeneration of both eyes Soft confluent drusen in the subfoveal location.  Patient continues on oral vitamin therapy  I did discuss with patient the importance of being aware of nighttime low oxygen levels that can develop from untreated or undiagnosed sleep apnea.  I have asked him to stay aware that if he has snoring significant nocturia which means in the bathroom more than once a night and/or falling asleep in the afternoons on days with low stress,      ICD-10-CM   1. Intermediate stage nonexudative age-related macular degeneration of both eyes  H35.3132 Color Fundus Photography Optos - OU - Both Eyes    OCT, Retina - OU - Both Eyes    2. History of retinal detachment  Z86.69     3. Macular pucker, left eye  H35.372       1.  2.  3.  Ophthalmic Meds Ordered this visit:  No orders of the defined types were placed in this encounter.      Return in about 6 months (around 10/31/2021) for DILATE OU, OCT.  There are no Patient Instructions on file for this visit.   Explained  the diagnoses, plan, and follow up with the patient and they expressed understanding.  Patient expressed understanding of the importance of proper follow up care.   Clent Demark Sharanda Shinault M.D. Diseases & Surgery of the Retina and Vitreous [facility]ina [facility] [place] 05/02/21     Abbreviations: M myopia (nearsighted); A astigmatism; H hyperopia (farsighted); P presbyopia; Mrx spectacle prescription;  CTL contact lenses; OD right eye; OS left eye; OU both eyes  XT exotropia; ET esotropia; PEK punctate epithelial keratitis; PEE punctate epithelial erosions; DES dry eye syndrome; MGD meibomian gland dysfunction; ATs artificial tears; PFAT's preservative free artificial tears; [place] nuclear sclerotic cataract; PSC posterior subcapsular cataract; ERM epi-retinal membrane; PVD posterior vitreous detachment; RD retinal detachment; DM diabetes mellitus; DR diabetic retinopathy; NPDR non-proliferative diabetic retinopathy; PDR proliferative diabetic retinopathy; CSME clinically significant macular edema; DME diabetic macular edema; dbh dot blot hemorrhages; CWS cotton wool spot; POAG primary open angle glaucoma; C/D cup-to-disc ratio; HVF humphrey visual field; GVF goldmann visual field; OCT optical coherence tomography; IOP intraocular pressure; BRVO Branch retinal vein occlusion; CRVO central retinal vein occlusion; CRAO central retinal artery occlusion; BRAO branch retinal artery occlusion; RT retinal tear; SB scleral buckle; PPV pars plana vitrectomy; VH Vitreous hemorrhage; PRP panretinal laser photocoagulation; IVK intravitreal kenalog; VMT vitreomacular traction; MH Macular hole;  NVD neovascularization of the disc; NVE neovascularization elsewhere; AREDS age related eye disease study; ARMD age related macular  degeneration; POAG primary open angle glaucoma; EBMD epithelial/anterior basement membrane dystrophy; ACIOL anterior chamber intraocular lens; IOL intraocular lens; PCIOL posterior chamber intraocular lens;  Phaco/IOL phacoemulsification with intraocular lens placement; [place] photorefractive keratectomy; LASIK laser assisted in situ keratomileusis; HTN hypertension; DM diabetes mellitus; COPD chronic obstructive pulmonary disease

## 2021-05-02 NOTE — Assessment & Plan Note (Signed)
Mild to moderate epiretinal membrane has progressed over the last 12 months however no impact on acuity will continue to observe

## 2021-05-02 NOTE — Assessment & Plan Note (Signed)
Soft confluent drusen in the subfoveal location.  Patient continues on oral vitamin therapy  I did discuss with patient the importance of being aware of nighttime low oxygen levels that can develop from untreated or undiagnosed sleep apnea.  I have asked him to stay aware that if he has snoring significant nocturia which means in the bathroom more than once a night and/or falling asleep in the afternoons on days with low stress,

## 2021-05-06 ENCOUNTER — Ambulatory Visit: Payer: Medicare Other

## 2021-05-06 ENCOUNTER — Other Ambulatory Visit: Payer: Self-pay

## 2021-05-06 DIAGNOSIS — R42 Dizziness and giddiness: Secondary | ICD-10-CM

## 2021-05-06 DIAGNOSIS — R2681 Unsteadiness on feet: Secondary | ICD-10-CM

## 2021-05-06 DIAGNOSIS — M6281 Muscle weakness (generalized): Secondary | ICD-10-CM

## 2021-05-06 DIAGNOSIS — R2689 Other abnormalities of gait and mobility: Secondary | ICD-10-CM

## 2021-05-06 NOTE — Therapy (Signed)
[name] [address] [place], [place], [address] Phone: [phone]   Fax:  [phone]  Physical Therapy Treatment  Patient Details  Name: Robert Hart MRN: [id number] Date of Birth: [birth year] Referring Provider (PT): Melida Quitter, MD   Encounter Date: 05/06/2021   PT End of Session - 05/06/21 1533     Visit Number 11    Number of Visits 18    Date for PT Re-Evaluation 06/25/21    Authorization Type Medicare + BCBS    Progress Note Due on Visit 8    PT Start Time 1531    PT Stop Time 1615    PT Time Calculation (min) 44 min    Activity Tolerance Patient tolerated treatment well    Behavior During Therapy [facility] for tasks assessed/performed             Past Medical History:  Diagnosis Date   Arthritis    Back pain    Dyspnea    ED (erectile dysfunction)    GERD (gastroesophageal reflux disease)    Hemorrhoids    Hyperlipidemia    Insomnia    Nocturia    PE (pulmonary embolism)    Rhinitis    Shoulder pain, bilateral    Urinary hesitancy     Past Surgical History:  Procedure Laterality Date   CARDIOVERSION N/A 11/16/2014   Procedure: CARDIOVERSION;  Surgeon: Sanda Klein, MD;  Location: [facility];  Service: Cardiovascular;  Laterality: N/A;   COLONOSCOPY     TONSILLECTOMY AND ADENOIDECTOMY     VASECTOMY  1989    There were no vitals filed for this visit.   Subjective Assessment - 05/06/21 1534     Subjective No new changes/complaints. Reports continue to do his exercises, still going well. Reports has appt with balance center next week.    Patient is accompained by: Family member   Robert Hart   Pertinent History Arthritis, Dyspnea, GERD, HLD, History of PE    Limitations Walking;Standing    Patient Stated Goals Get Rid of the Vertigo    Currently in Pain? No/denies               Vestibular Treatment/Exercise - 05/06/21 0001       Vestibular Treatment/Exercise   Vestibular  Treatment Provided Gaze    Gaze Exercises X1 Viewing Horizontal;X1 Viewing Vertical;X2 Viewing Horizontal;X2 Viewing Vertical      X1 Viewing Horizontal   Foot Position forward with ambulation    Reps 3    Comments x 10' forward/backwards ambulation, mild unsteadiness CGA      X1 Viewing Vertical   Foot Position forward with ambulation    Reps 3    Comments x 10' forward/backwards ambulation, mild unsteadiness CGA      X2 Viewing Horizontal   Foot Position standing bil stance    Reps 2     Comments x 60 seconds; intermittent stops for proper technique, slowed pace      X2 Viewing Vertical   Foot Position standing bil stance    Reps 2    Comments x 60 seconds; intermittent stops for proper technique, slowed pace                Balance Exercises - 05/06/21 0001       Balance Exercises: Standing   Rockerboard Anterior/posterior;Head turns;EO;Limitations    Rockerboard Limitations standing on rockerboard, holding steady x 30 seconds. then added in vertical head turns x 10 reps. intermittent touch A to //  bars    Tandem Gait Forward;Intermittent upper extremity support;5 reps;Limitations    Tandem Gait Limitations completed tandem gait forward x 5 laps, inital 3 with no UE support on firm surface cues for upright posture. then x 2 laps with addition of horizontal/vertical head turns.    Marching Foam/compliant surface;Head turns;Static;Limitations    Marching Limitations standing on blue mat completed alternating marching with EO x 60 seconds, then progressed to EO with addition of horizontal/vertical head turns 2 x 10 reps each direction.    Other Standing Exercises standing on upward incline on blue mat: standing staggered stance with EC 2 x 30 seconds alternating feet. then downward incline w/ narrow BOS EC 2 x 30 seconds             Access Code: [email or web address] URL: [email or web address] Date: 05/06/2021 Prepared by: Baldomero Lamy  Exercises Standing  Balance with Eyes Closed on Foam - 1 x daily - 7 x weekly - 1 sets - 3 reps - 30 seconds hold Tandem Stance on Foam Pad with Eyes Open - 1 x daily - 7 x weekly - 1 sets - 3 reps - 15-20 seconds hold Half Tandem Stance Balance with Head Rotation - 1 x daily - 7 x weekly - 2 sets - 10 reps Wide Stance with Eyes Closed and Head Nods on Foam Pad - 1 x daily - 7 x weekly - 2 sets - 10 reps Wide Stance with Eyes Closed and Head Rotation on Foam Pad - 1 x daily - 7 x weekly - 2 sets - 10 reps Walking with Head Rotation - 1 x daily - 7 x weekly - 1 sets - 3-4 reps Tandem Walking - 1 x daily - 7 x weekly - 1 sets - 3-4 reps Standing Gaze Stabilization with Head Rotation and Horizontal Arm Movement - 1 x daily - 7 x weekly - 1 sets - 2 reps - 60 secs hold    PT Education - 05/06/21 1627     Education Details HEP addition    Person(s) Educated Patient    Methods Explanation;Demonstration;Handout    Comprehension Returned demonstration;Verbalized understanding              PT Short Term Goals - 03/31/21 1914       PT SHORT TERM GOAL #1   Title = LTGs               PT Long Term Goals - 04/27/21 1018       PT LONG TERM GOAL #1   Title Patient will be independent with progressed vestibular/balance HEP (LTG Set at 4 weeks 05/27/21 and then reset again for 8 week POC 06/26/21)    Baseline independent with current HEP; needs to be updated based on progress    Time 4    Period Weeks    Status Revised    Target Date 05/27/21      PT LONG TERM GOAL #2   Title Patient will improve FGA to >/= 28/30    Baseline 26/30; no change    Time 4    Period Weeks    Status Revised    Target Date 05/27/21      PT LONG TERM GOAL #3   Title Patient will improve situation 4 of M-CTSIB to >/= 25 seconds to demo improved vestibular input for balance    Baseline 30 seconds all 4 conditions    Status Achieved      PT LONG TERM GOAL #4  Title Patient will improve DVA to </= 2 line difference to  indicative improved VOR    Baseline increased to 4 line difference today (8, 4)    Time 4    Period Weeks    Status Revised    Target Date 05/27/21      PT LONG TERM GOAL #5   Title Patient will improve DFS >/= 60% and DPS to >/= 60    Baseline 58 DFS; 55 DPS    Time 4    Period Weeks    Status Revised    Target Date 05/27/21                   Plan - 05/06/21 1627     Clinical Impression Statement Continued VOR x 1 with ambulation and introduced VOR x 2 standing. Increased challenge with VOR x 2 initially, but demo improvements iwth sequencing with increased reps. Continued high level balance activites working on dynamic with eyes closed, complaint surfaces, and head turns. Will continue to progress toward all LTGs.    Personal Factors and Comorbidities Comorbidity 3+;Age    Comorbidities Arthritis, Dyspnea, GERD, HLD, History of PE, A-Fib    Examination-Activity Limitations Locomotion Level;Transfers;Bend;Stairs    Examination-Participation Restrictions Community Activity;Occupation;Yard Work;Driving    Rehab Potential Good    PT Frequency 1x / week    PT Duration 8 weeks    PT Treatment/Interventions ADLs/Self Care Home Management;Aquatic Therapy;Canalith Repostioning;Electrical Stimulation;Moist Heat;Cryotherapy;DME Instruction;Gait training;Stair training;Functional mobility training;Therapeutic activities;Therapeutic exercise;Balance training;Neuromuscular re-education;Patient/family education;Vestibular    PT Next Visit Plan Continue to work on dynamic balance and head turns; marching. VOR with ambulation Focus on balance with eyes closed, compliant surfaces, more visually complex environments. visual tracking    PT Home Exercise Plan [employer] [email or web address]    Consulted and Agree with Plan of Care Patient             Patient will benefit from skilled therapeutic intervention in order to improve the following deficits and impairments:  Abnormal gait, Difficulty  walking, Dizziness, Decreased activity tolerance, Decreased endurance, Decreased balance, Decreased strength  Visit Diagnosis: Dizziness and giddiness  Unsteadiness on feet  Other abnormalities of gait and mobility  Muscle weakness (generalized)     Problem List Patient Active Problem List   Diagnosis Date Noted   History of retinal detachment 05/03/2020   History of vitrectomy 05/03/2020   Macular pucker, left eye 05/03/2020   Intermediate stage nonexudative age-related macular degeneration of both eyes 10/30/2019   Longstanding persistent atrial fibrillation ([place])    Paroxysmal atrial fibrillation ([place]) 10/12/2014    Jones Bales, PT, DPT 05/06/2021, 4:33 PM  [facility] [address] [place], [place], [address] Phone: [phone]   Fax:  [phone]  Name: Robert Hart MRN: [id number] Date of Birth: 1951-04-26

## 2021-05-06 NOTE — Patient Instructions (Signed)
Access Code: [email or web address] URL: [email or web address] Date: 05/06/2021 Prepared by: Baldomero Lamy  Exercises Standing Balance with Eyes Closed on Foam - 1 x daily - 7 x weekly - 1 sets - 3 reps - 30 seconds hold Tandem Stance on Foam Pad with Eyes Open - 1 x daily - 7 x weekly - 1 sets - 3 reps - 15-20 seconds hold Half Tandem Stance Balance with Head Rotation - 1 x daily - 7 x weekly - 2 sets - 10 reps Wide Stance with Eyes Closed and Head Nods on Foam Pad - 1 x daily - 7 x weekly - 2 sets - 10 reps Wide Stance with Eyes Closed and Head Rotation on Foam Pad - 1 x daily - 7 x weekly - 2 sets - 10 reps Walking with Head Rotation - 1 x daily - 7 x weekly - 1 sets - 3-4 reps Tandem Walking - 1 x daily - 7 x weekly - 1 sets - 3-4 reps Standing Gaze Stabilization with Head Rotation and Horizontal Arm Movement - 1 x daily - 7 x weekly - 1 sets - 2 reps - 60 secs hold

## 2021-05-11 ENCOUNTER — Ambulatory Visit: Payer: Medicare Other

## 2021-05-12 DIAGNOSIS — H9311 Tinnitus, right ear: Secondary | ICD-10-CM | POA: Diagnosis not present

## 2021-05-12 DIAGNOSIS — H903 Sensorineural hearing loss, bilateral: Secondary | ICD-10-CM | POA: Diagnosis not present

## 2021-05-12 DIAGNOSIS — R42 Dizziness and giddiness: Secondary | ICD-10-CM | POA: Diagnosis not present

## 2021-05-12 DIAGNOSIS — H8101 Meniere's disease, right ear: Secondary | ICD-10-CM | POA: Diagnosis not present

## 2021-05-19 ENCOUNTER — Ambulatory Visit: Payer: Medicare Other

## 2021-05-19 ENCOUNTER — Other Ambulatory Visit: Payer: Self-pay

## 2021-05-19 DIAGNOSIS — H903 Sensorineural hearing loss, bilateral: Secondary | ICD-10-CM | POA: Diagnosis not present

## 2021-05-19 DIAGNOSIS — M6281 Muscle weakness (generalized): Secondary | ICD-10-CM | POA: Diagnosis not present

## 2021-05-19 DIAGNOSIS — R2689 Other abnormalities of gait and mobility: Secondary | ICD-10-CM | POA: Diagnosis not present

## 2021-05-19 DIAGNOSIS — R2681 Unsteadiness on feet: Secondary | ICD-10-CM | POA: Diagnosis not present

## 2021-05-19 DIAGNOSIS — H9313 Tinnitus, bilateral: Secondary | ICD-10-CM | POA: Diagnosis not present

## 2021-05-19 DIAGNOSIS — R42 Dizziness and giddiness: Secondary | ICD-10-CM | POA: Diagnosis not present

## 2021-05-19 DIAGNOSIS — H9123 Sudden idiopathic hearing loss, bilateral: Secondary | ICD-10-CM | POA: Diagnosis not present

## 2021-05-19 NOTE — Therapy (Signed)
[facility] [address] [place], [place], [address] Phone: [phone]   Fax:  [phone]  Physical Therapy Treatment  Patient Details  Name: Robert Hart MRN: [id number] Date of Birth: 05-16-1951 Referring Provider (PT): Melida Quitter, MD   Encounter Date: 05/19/2021   PT End of Session - 05/19/21 0850     Visit Number 12    Number of Visits 18    Date for PT Re-Evaluation 06/25/21    Authorization Type Medicare + BCBS    Progress Note Due on Visit 12    PT Start Time [id number]    PT Stop Time 0930    PT Time Calculation (min) 44 min    Activity Tolerance Patient tolerated treatment well    Behavior During Therapy [facility] for tasks assessed/performed             Past Medical History:  Diagnosis Date   Arthritis    Back pain    Dyspnea    ED (erectile dysfunction)    GERD (gastroesophageal reflux disease)    Hemorrhoids    Hyperlipidemia    Insomnia    Nocturia    PE (pulmonary embolism)    Rhinitis    Shoulder pain, bilateral    Urinary hesitancy     Past Surgical History:  Procedure Laterality Date   CARDIOVERSION N/A 11/16/2014   Procedure: CARDIOVERSION;  Surgeon: Sanda Klein, MD;  Location: [facility];  Service: Cardiovascular;  Laterality: N/A;   COLONOSCOPY     TONSILLECTOMY AND ADENOIDECTOMY     VASECTOMY  1989    There were no vitals filed for this visit.   Subjective Assessment - 05/19/21 0847     Subjective Reports balance center noted a 60% loss in the R eye, but reports that they believe he is doing very well. Reports has a CT scan this afternoon to determine if he is able to have cochlear implant.    Patient is accompained by: Family member   [name]   Pertinent History Arthritis, Dyspnea, GERD, HLD, History of PE    Limitations Walking;Standing    Patient Stated Goals Get Rid of the Vertigo    Currently in Pain? No/denies              Vestibular Treatment/Exercise -  05/19/21 0001       Vestibular Treatment/Exercise   Vestibular Treatment Provided Gaze    Gaze Exercises X2 Viewing Horizontal;X2 Viewing Vertical      X2 Viewing Horizontal   Foot Position standing bil stance    Reps 2     Comments x 60 seconds;slowed pace, reviewed for HEP      X2 Viewing Vertical   Foot Position standing bil stance    Reps 2    Comments x 60 seconds;slowed pace, reviewed for HEP                Balance Exercises - 05/19/21 0001       Balance Exercises: Standing   Standing Eyes Opened Narrow base of support (BOS);Head turns;Foam/compliant surface;Limitations    Standing Eyes Opened Limitations feet hip width apart, followed by narrow BOS standing on thick dense blue foam, completed horizontal/vertical head turns x 10 reps each. continue to demo increaed sway with horiz > vertical.    Standing Eyes Closed Narrow base of support (BOS);Head turns;Foam/compliant surface;3 reps;30 secs;Limitations    Standing Eyes Closed Limitations standing with bil stance (hip width) completed horizontal/vertical head turns on thick dense blue  foam 2 x 10 reps each direction, then with narrow BOS completed eyes closed 3 x 15-20 seconds    Tandem Stance Eyes open;Foam/compliant surface;3 reps;25 secs;Limitations    Tandem Stance Time stnading partial tandem on thick blue dense foam, completed x 30 seconds, then continue progressing anterior foot forward for increased challenge, completed x 3 reps bilat.    SLS Eyes open;Foam/compliant surface;Intermittent upper extremity support;3 reps;Time    SLS Time 10-15 seconds; intermittent UE support, alternating leg on thick blue dense foam.    Marching Foam/compliant surface;Intermittent upper extremity assist;Head turns;Static;Limitations    Marching Limitations standing on thick blue dense foam, completed static marching with EO x 30 seconds, then progressed to horiz/vertical head turns. Completed 3 x 30 seconds. Increased challenge with  addition of head turns, PT cueing to break head turns with pause at midline to allow for improved balance.                PT Education - 05/19/21 0939     Education Details Review of VOR x 2    Person(s) Educated Patient    Methods Explanation;Demonstration;Handout    Comprehension Returned demonstration;Verbalized understanding              PT Short Term Goals - 03/31/21 1914       PT SHORT TERM GOAL #1   Title = LTGs               PT Long Term Goals - 04/27/21 1018       PT LONG TERM GOAL #1   Title Patient will be independent with progressed vestibular/balance HEP (LTG Set at 4 weeks 05/27/21 and then reset again for 8 week POC 06/26/21)    Baseline independent with current HEP; needs to be updated based on progress    Time 4    Period Weeks    Status Revised    Target Date 05/27/21      PT LONG TERM GOAL #2   Title Patient will improve FGA to >/= 28/30    Baseline 26/30; no change    Time 4    Period Weeks    Status Revised    Target Date 05/27/21      PT LONG TERM GOAL #3   Title Patient will improve situation 4 of M-CTSIB to >/= 25 seconds to demo improved vestibular input for balance    Baseline 30 seconds all 4 conditions    Status Achieved      PT LONG TERM GOAL #4   Title Patient will improve DVA to </= 2 line difference to indicative improved VOR    Baseline increased to 4 line difference today (8, 4)    Time 4    Period Weeks    Status Revised    Target Date 05/27/21      PT LONG TERM GOAL #5   Title Patient will improve DFS >/= 60% and DPS to >/= 60    Baseline 58 DFS; 55 DPS    Time 4    Period Weeks    Status Revised    Target Date 05/27/21                   Plan - 05/19/21 0939     Clinical Impression Statement Continued VOR x 2 in standing as patient has not completed at home, reviewed with patient able to demo proper technique. Reviewed and provided new handout for home. Continued balance activities with focus on  thick  blue foam for further chalenge, patient tolerating well. Continue to demo increase challenge with vision removed and horiz > vertical head turns.    Personal Factors and Comorbidities Comorbidity 3+;Age    Comorbidities Arthritis, Dyspnea, GERD, HLD, History of PE, A-Fib    Examination-Activity Limitations Locomotion Level;Transfers;Bend;Stairs    Examination-Participation Restrictions Community Activity;Occupation;Yard Work;Driving    Rehab Potential Good    PT Frequency 1x / week    PT Duration 8 weeks    PT Treatment/Interventions ADLs/Self Care Home Management;Aquatic Therapy;Canalith Repostioning;Electrical Stimulation;Moist Heat;Cryotherapy;DME Instruction;Gait training;Stair training;Functional mobility training;Therapeutic activities;Therapeutic exercise;Balance training;Neuromuscular re-education;Patient/family education;Vestibular    PT Next Visit Plan Continue to work on dynamic balance and head turns; marching. Have been using Thick Foam. VOR with ambulation, VOR x 2.  Focus on balance with eyes closed, compliant surfaces, more visually complex environments. visual tracking    PT Home Exercise Plan Medbridge [email or web address]    Consulted and Agree with Plan of Care Patient             Patient will benefit from skilled therapeutic intervention in order to improve the following deficits and impairments:  Abnormal gait, Difficulty walking, Dizziness, Decreased activity tolerance, Decreased endurance, Decreased balance, Decreased strength  Visit Diagnosis: Dizziness and giddiness  Unsteadiness on feet  Other abnormalities of gait and mobility  Muscle weakness (generalized)     Problem List Patient Active Problem List   Diagnosis Date Noted   History of retinal detachment 05/03/2020   History of vitrectomy 05/03/2020   Macular pucker, left eye 05/03/2020   Intermediate stage nonexudative age-related macular degeneration of both eyes 10/30/2019   Longstanding persistent  atrial fibrillation ([place])    Paroxysmal atrial fibrillation ([place]) 10/12/2014    Jones Bales, PT, DPT 05/19/2021, 9:44 AM  [facility] [address] [place], [place], [address] Phone: [phone]   Fax:  [phone]  Name: JANES COLEGROVE MRN: [id number] Date of Birth: 1950/07/27

## 2021-05-29 ENCOUNTER — Other Ambulatory Visit (HOSPITAL_COMMUNITY): Payer: Self-pay | Admitting: Nurse Practitioner

## 2021-06-06 ENCOUNTER — Other Ambulatory Visit: Payer: Self-pay

## 2021-06-06 ENCOUNTER — Ambulatory Visit: Payer: Medicare Other | Attending: Otolaryngology | Admitting: Physical Therapy

## 2021-06-06 DIAGNOSIS — M6281 Muscle weakness (generalized): Secondary | ICD-10-CM | POA: Insufficient documentation

## 2021-06-06 DIAGNOSIS — R2689 Other abnormalities of gait and mobility: Secondary | ICD-10-CM | POA: Insufficient documentation

## 2021-06-06 DIAGNOSIS — R42 Dizziness and giddiness: Secondary | ICD-10-CM | POA: Diagnosis not present

## 2021-06-06 DIAGNOSIS — R2681 Unsteadiness on feet: Secondary | ICD-10-CM | POA: Diagnosis not present

## 2021-06-06 NOTE — Patient Instructions (Signed)
Access Code: [email or web address] URL: [email or web address] Date: 06/06/2021 Prepared by: Misty Stanley  Exercises Walking with Head Rotation - 1 x daily - 7 x weekly - 1 sets - 3-4 reps Tandem Walking - 1 x daily - 7 x weekly - 1 sets - 3-4 reps Standing Gaze Stabilization with Head Rotation and Horizontal Arm Movement - 1 x daily - 7 x weekly - 1 sets - 2 reps - 60 secs hold Walking with Eyes Closed and Counter Support - 1 x daily - 7 x weekly - 4 sets Feet Together Balance with Head Nods on Foam Pad - 1 x daily - 7 x weekly - 1 sets - 10 reps Romberg Stance on Foam Pad with Head Rotation - 1 x daily - 7 x weekly - 1 sets - 10 reps

## 2021-06-06 NOTE — Therapy (Signed)
[facility] °[facility] °[address] °[place], [place], [address] °Phone: [phone]   Fax:  [phone] ° °Physical Therapy Treatment ° °Patient Details  °Name: Robert Hart °MRN: [id number] °Date of Birth: 08/31/1950 °Referring Provider (PT): Dwight Bates, MD ° ° °Encounter Date: 06/06/2021 ° ° PT End of Session - 06/06/21 1708   ° ° Visit Number 13   ° Number of Visits 18   ° Date for PT Re-Evaluation 06/25/21   ° Authorization Type Medicare + BCBS   ° Progress Note Due on Visit 20   ° PT Start Time 1615   ° PT Stop Time 1700   ° PT Time Calculation (min) 45 min   ° Activity Tolerance Patient tolerated treatment well   ° Behavior During Therapy WFL for tasks assessed/performed   ° °  °  ° °  ° ° °Past Medical History:  °Diagnosis Date  ° Arthritis   ° Back pain   ° Dyspnea   ° ED (erectile dysfunction)   ° GERD (gastroesophageal reflux disease)   ° Hemorrhoids   ° Hyperlipidemia   ° Insomnia   ° Nocturia   ° PE (pulmonary embolism)   ° Rhinitis   ° Shoulder pain, bilateral   ° Urinary hesitancy   ° ° °Past Surgical History:  °Procedure Laterality Date  ° CARDIOVERSION N/A 11/16/2014  ° Procedure: CARDIOVERSION;  Surgeon: Mihai Croitoru, MD;  [facility];  Service: Cardiovascular;  Laterality: N/A;  ° COLONOSCOPY    ° TONSILLECTOMY AND ADENOIDECTOMY    ° VASECTOMY  1989  ° ° °There were no vitals filed for this visit. ° ° Subjective Assessment - 06/06/21 1620   ° ° Subjective Was in [place] for vacation and wasn't able to do exercises.  Dizziness is still there with movement.  Had imaging to determine if he could cochlear implants; waiting to hear back about cost. Flying/plane and being on a boat did not bother the dizziness.   ° Patient is accompained by: Family member   Theresa  ° Pertinent History Arthritis, Dyspnea, GERD, HLD, History of PE   ° Limitations Walking;Standing   ° Patient Stated Goals Get Rid of the Vertigo   ° Currently in Pain? No/denies    ° °  °  ° °  ° ° ° ° ° [facility] PT Assessment - 06/06/21 1635   ° °  ° Assessment  ° Medical Diagnosis Vertigo   ° Referring Provider (PT) Dwight Bates, MD   ° Onset Date/Surgical Date 02/07/21   ° Prior Therapy No PT Prior for Vertigo   °  ° Precautions  ° Precautions Other (comment)   ° Precaution Comments Arthritis, Dyspnea, GERD, HLD, History of PE, A-Fib   °  ° Observation/Other Assessments  ° Focus on Therapeutic Outcomes (FOTO)  DFS: 56 (goal level); DPS: 60.7   °  ° Functional Gait  Assessment  ° Gait assessed  Yes   ° Gait Level Surface Walks 20 ft in less than 5.5 sec, no assistive devices, good speed, no evidence for imbalance, normal gait pattern, deviates no more than 6 in outside of the 12 in walkway width.   ° Change in Gait Speed Able to smoothly change walking speed without loss of balance or gait deviation. Deviate no more than 6 in outside of the 12 in walkway width.   ° Gait with Horizontal Head Turns Performs head turns smoothly with slight change in gait velocity (eg, minor disruption to   smooth gait path), deviates 6-10 in outside 12 in walkway width, or uses an assistive device.    Gait with Vertical Head Turns Performs head turns with no change in gait. Deviates no more than 6 in outside 12 in walkway width.    Gait and Pivot Turn Pivot turns safely within 3 sec and stops quickly with no loss of balance.    Step Over Obstacle Is able to step over 2 stacked shoe boxes taped together (9 in total height) without changing gait speed. No evidence of imbalance.    Gait with Narrow Base of Support Is able to ambulate for 10 steps heel to toe with no staggering.    Gait with Eyes Closed Walks 20 ft, slow speed, abnormal gait pattern, evidence for imbalance, deviates 10-15 in outside 12 in walkway width. Requires more than 9 sec to ambulate 20 ft.    Ambulating Backwards Walks 20 ft, no assistive devices, good speed, no evidence for imbalance, normal gait    Steps Alternating feet, no rail.     Total Score 27    FGA comment: 27/30                 Vestibular Assessment - 06/06/21 1624       Visual Acuity   Static 8    Dynamic 4   no change     Balancemaster   Balancemaster Comment MCTSIB: 30 seconds all 4 conditions, mild-moderate sway 4th condition                           Balance Exercises - 06/06/21 1706       Balance Exercises: Standing   Standing Eyes Opened Narrow base of support (BOS);Head turns;Foam/compliant surface;5 reps    Standing Eyes Opened Limitations no UE support, no imbalance    Standing Eyes Closed Narrow base of support (BOS);Head turns;Foam/compliant surface;5 reps    Standing Eyes Closed Limitations feet together on foam; head turns and head nods with one UE support    Other Standing Exercises At counter with one finger tip support performed walking forwards, slowly with EC x 4 laps down and back                  PT Short Term Goals - 03/31/21 1914       PT SHORT TERM GOAL #1   Title = LTGs               PT Long Term Goals - 06/06/21 1709       PT LONG TERM GOAL #1   Title Patient will be independent with progressed vestibular/balance HEP    Baseline updated HEP today    Time 4    Period Weeks    Status Revised    Target Date 06/25/21      PT LONG TERM GOAL #2   Title Patient will improve FGA to >/= 28/30    Baseline 27/30    Time 4    Period Weeks    Status On-going    Target Date 06/25/21      PT LONG TERM GOAL #3   Title Patient will improve situation 4 of M-CTSIB to >/= 25 seconds to demo improved vestibular input for balance    Baseline 30 seconds all 4 conditions    Status Achieved      PT LONG TERM GOAL #4   Title Patient will improve DVA to </= 2 line difference to  indicative improved VOR   ° Baseline no change, still a 4 line difference (8, 4)   ° Time 4   ° Period Weeks   ° Status On-going   ° Target Date 06/25/21   °  ° PT LONG TERM GOAL #5  ° Title Patient will improve DFS  >/= 60% and DPS to >/= 60   ° Baseline DFS: 56% and DPS: 60.7   ° Time 4   ° Period Weeks   ° Status Partially Met   ° Target Date 06/25/21   ° °  °  ° °  ° ° ° ° ° ° ° ° Plan - 06/06/21 1711   ° ° Clinical Impression Statement Pt returns from multiple weeks off from therapy due to travel.  Performed assessment of progress towards LTG.  Pt is making good progress and was able to be very active when traveling.  Pt has maintained progress with MCTSIB and demonstrates slight improvement in FGA.  Pt continues to have increased difficulty with horizontal head turns and when vision removed.  No change noted in DVA.  Pt did experience slight improvement in DPS and DFS on FOTO but not to goal level.  Goals have been updated and plan to continue PT POC through end of certification period to address ongoing impairments and unmet goals.  Provided pt with updated and progressed HEP today.   ° Personal Factors and Comorbidities Comorbidity 3+;Age   ° Comorbidities Arthritis, Dyspnea, GERD, HLD, History of PE, A-Fib   ° Examination-Activity Limitations Locomotion Level;Transfers;Bend;Stairs   ° Examination-Participation Restrictions Community Activity;Occupation;Yard Work;Driving   ° Rehab Potential Good   ° PT Frequency 1x / week   ° PT Duration 8 weeks   ° PT Treatment/Interventions ADLs/Self Care Home Management;Aquatic Therapy;Canalith Repostioning;Electrical Stimulation;Moist Heat;Cryotherapy;DME Instruction;Gait training;Stair training;Functional mobility training;Therapeutic activities;Therapeutic exercise;Balance training;Neuromuscular re-education;Patient/family education;Vestibular   ° PT Next Visit Plan Continue to work on high level VOR/busy background/x2 viewing.  Balance with EC, walking with EC.   ° PT Home Exercise Plan Medbridge [email or web address]   ° Consulted and Agree with Plan of Care Patient   ° °  °  ° °  ° ° °Patient will benefit from skilled therapeutic intervention in order to improve the following deficits and  impairments:  Abnormal gait, Difficulty walking, Dizziness, Decreased activity tolerance, Decreased endurance, Decreased balance, Decreased strength ° °Visit Diagnosis: °Dizziness and giddiness ° °Unsteadiness on feet ° °Other abnormalities of gait and mobility ° °Muscle weakness (generalized) ° ° ° ° °Problem List °Patient Active Problem List  ° Diagnosis Date Noted  ° History of retinal detachment 05/03/2020  ° History of vitrectomy 05/03/2020  ° Macular pucker, left eye 05/03/2020  ° Intermediate stage nonexudative age-related macular degeneration of both eyes 10/30/2019  ° Longstanding persistent atrial fibrillation ([facility])   ° Paroxysmal atrial fibrillation ([facility]) 10/12/2014  ° ° °Audra F Potter, PT, DPT °06/06/21    5:18 PM ° ° °[facility] °[facility] °[address] °[place], [place], [address] °Phone: [phone]   Fax:  [phone] ° °Name: Robert Hart °MRN: [id number] °Date of Birth: 08/26/1950 ° ° ° °

## 2021-06-09 DIAGNOSIS — L853 Xerosis cutis: Secondary | ICD-10-CM | POA: Diagnosis not present

## 2021-06-09 DIAGNOSIS — L821 Other seborrheic keratosis: Secondary | ICD-10-CM | POA: Diagnosis not present

## 2021-06-09 DIAGNOSIS — Z85828 Personal history of other malignant neoplasm of skin: Secondary | ICD-10-CM | POA: Diagnosis not present

## 2021-06-09 DIAGNOSIS — L570 Actinic keratosis: Secondary | ICD-10-CM | POA: Diagnosis not present

## 2021-06-09 DIAGNOSIS — L111 Transient acantholytic dermatosis [Grover]: Secondary | ICD-10-CM | POA: Diagnosis not present

## 2021-06-13 ENCOUNTER — Ambulatory Visit: Payer: Medicare Other | Admitting: Physical Therapy

## 2021-06-13 ENCOUNTER — Other Ambulatory Visit: Payer: Self-pay

## 2021-06-13 DIAGNOSIS — R42 Dizziness and giddiness: Secondary | ICD-10-CM | POA: Diagnosis not present

## 2021-06-13 DIAGNOSIS — M6281 Muscle weakness (generalized): Secondary | ICD-10-CM | POA: Diagnosis not present

## 2021-06-13 DIAGNOSIS — R2681 Unsteadiness on feet: Secondary | ICD-10-CM

## 2021-06-13 DIAGNOSIS — R2689 Other abnormalities of gait and mobility: Secondary | ICD-10-CM | POA: Diagnosis not present

## 2021-06-13 NOTE — Patient Instructions (Signed)
Access Code: [email or web address] URL: [email or web address] Date: 06/13/2021 Prepared by: Misty Stanley  Exercises Tandem Walking - 1 x daily - 7 x weekly - 1 sets - 3-4 reps Standing Gaze Stabilization with Head Rotation and Horizontal Arm Movement - 1 x daily - 7 x weekly - 1 sets - 2 reps - 60 secs hold Walking with Eyes Closed and Counter Support - 1 x daily - 7 x weekly - 4 sets Feet Together Balance with Head Nods on Foam Pad - 1 x daily - 7 x weekly - 1 sets - 10 reps Romberg Stance on Foam Pad with Head Rotation - 1 x daily - 7 x weekly - 1 sets - 10 reps Walking Gaze Stabilization Head Rotation - 1 x daily - 7 x weekly - 1 sets - 60 second hold - added back in walking forwards and backwards

## 2021-06-13 NOTE — Therapy (Signed)
[facility] [address] [place], [place], [address] Phone: [phone]   Fax:  [phone]  Physical Therapy Treatment  Patient Details  Name: [place] MRN: [id number] Date of Birth: [birth year] Referring Provider (PT): Melida Quitter, MD   Encounter Date: 06/13/2021   PT End of Session - 06/13/21 1705     Visit Number 14    Number of Visits 18    Date for PT Re-Evaluation 06/25/21    Authorization Type Medicare + BCBS    Progress Note Due on Visit 63    PT Start Time 1615    PT Stop Time 1700    PT Time Calculation (min) 45 min    Activity Tolerance Patient tolerated treatment well    Behavior During Therapy [facility] for tasks assessed/performed             Past Medical History:  Diagnosis Date   Arthritis    Back pain    Dyspnea    ED (erectile dysfunction)    GERD (gastroesophageal reflux disease)    Hemorrhoids    Hyperlipidemia    Insomnia    Nocturia    PE (pulmonary embolism)    Rhinitis    Shoulder pain, bilateral    Urinary hesitancy     Past Surgical History:  Procedure Laterality Date   CARDIOVERSION N/A 11/16/2014   Procedure: CARDIOVERSION;  Surgeon: Sanda Klein, MD;  Location: [facility];  Service: Cardiovascular;  Laterality: N/A;   COLONOSCOPY     TONSILLECTOMY AND ADENOIDECTOMY     VASECTOMY  1989    There were no vitals filed for this visit.   Subjective Assessment - 06/13/21 1614     Subjective Hasn't heard back about cochlear implants, is going to call this week.  Dizziness is okay; still fluctuating but always feel under control.    Patient is accompained by: Family member   [place]   Pertinent History Arthritis, Dyspnea, GERD, HLD, History of PE    Limitations Walking;Standing    Patient Stated Goals Get Rid of the Vertigo    Currently in Pain? No/denies               Balance Exercises - 06/13/21 1637       Balance Exercises: Standing   Balance Beam  Standing across blue balance beam performing rotations to take cones from counter and rotate to place cones on ground down and to ipsilateral side x 6; changed to reaching down to ground back to standing and rotate to place cone on counter behind x 6 reps (3 each side) 2 sets of each standing position.  Changed to standing facing counter and turning, reaching across midline for cones to L and R x 5 reps each side x 2 sets attempting to increase speed of turning.    Tandem Gait Forward;Upper extremity support;Intermittent upper extremity support;Foam/compliant surface;5 reps;Limitations    Tandem Gait Limitations walking tandem and then holding each foot position x 3 seconds with and without UE support    Sidestepping Foam/compliant support;5 reps;Limitations    Sidestepping Limitations standing on foam, taking one side step off and then back on to foam, alternating sides x 3 reps with EO, x 5 reps with EC with min-mod A for balance    Sit to Stand Without upper extremity support;Foam/compliant surface;Limitations    Sit to Stand Limitations standing feet apart on blue foam; 3 reps EO, 10 reps EC with min guard/min A for balance  PT Education - 06/13/21 1704     Education Details added walking VOR x 1 back into HEP    Person(s) Educated Patient    Methods Explanation;Demonstration    Comprehension Verbalized understanding;Returned demonstration              PT Short Term Goals - 03/31/21 1914       PT SHORT TERM GOAL #1   Title = LTGs               PT Long Term Goals - 06/06/21 1709       PT LONG TERM GOAL #1   Title Patient will be independent with progressed vestibular/balance HEP    Baseline updated HEP today    Time 4    Period Weeks    Status Revised    Target Date 06/25/21      PT LONG TERM GOAL #2   Title Patient will improve FGA to >/= 28/30    Baseline 27/30    Time 4    Period Weeks    Status On-going    Target Date 06/25/21      PT  LONG TERM GOAL #3   Title Patient will improve situation 4 of M-CTSIB to >/= 25 seconds to demo improved vestibular input for balance    Baseline 30 seconds all 4 conditions    Status Achieved      PT LONG TERM GOAL #4   Title Patient will improve DVA to </= 2 line difference to indicative improved VOR    Baseline no change, still a 4 line difference (8, 4)    Time 4    Period Weeks    Status On-going    Target Date 06/25/21      PT LONG TERM GOAL #5   Title Patient will improve DFS >/= 60% and DPS to >/= 60    Baseline DFS: 56% and DPS: 60.7    Time 4    Period Weeks    Status Partially Met    Target Date 06/25/21                   Plan - 06/13/21 1705     Clinical Impression Statement Continued to review gaze adaptation but no progression added today due to ongoing challenge.  Progressed overall balance by utilizing more dynamic and functional movements combined with compliant surfaces and vision removed.  Pt continues to have greatest difficulty with vision removed and experienced multiple LOB backwards and to the L.  Will continue to address and progress towards LTG.    Personal Factors and Comorbidities Comorbidity 3+;Age    Comorbidities Arthritis, Dyspnea, GERD, HLD, History of PE, A-Fib    Examination-Activity Limitations Locomotion Level;Transfers;Bend;Stairs    Examination-Participation Restrictions Community Activity;Occupation;Yard Work;Driving    Rehab Potential Good    PT Frequency 1x / week    PT Duration 8 weeks    PT Treatment/Interventions ADLs/Self Care Home Management;Aquatic Therapy;Canalith Repostioning;Electrical Stimulation;Moist Heat;Cryotherapy;DME Instruction;Gait training;Stair training;Functional mobility training;Therapeutic activities;Therapeutic exercise;Balance training;Neuromuscular re-education;Patient/family education;Vestibular    PT Next Visit Plan Continue to work on high level VOR walking forwards/backwards/busy background/x2 viewing.   Balance with EC, walking with EC.    PT [place] [id number]    Consulted and Agree with Plan of Care Patient             Patient will benefit from skilled therapeutic intervention in order to improve the following deficits and impairments:  Abnormal gait, Difficulty walking, Dizziness,  Decreased activity tolerance, Decreased endurance, Decreased balance, Decreased strength  Visit Diagnosis: Dizziness and giddiness  Unsteadiness on feet  Other abnormalities of gait and mobility     Problem List Patient Active Problem List   Diagnosis Date Noted   History of retinal detachment 05/03/2020   History of vitrectomy 05/03/2020   Macular pucker, left eye 05/03/2020   Intermediate stage nonexudative age-related macular degeneration of both eyes 10/30/2019   Longstanding persistent atrial fibrillation ([place])    Paroxysmal atrial fibrillation ([place]) 10/12/2014    Rico Junker, PT, DPT 06/13/21    5:09 PM    [facility] [address] [place], [place], [address] Phone: [phone]   Fax:  [phone]  Name: KINGSTON SHAWGO MRN: [id number] Date of Birth: 12/28/1950

## 2021-06-21 ENCOUNTER — Ambulatory Visit: Payer: Medicare Other

## 2021-06-21 ENCOUNTER — Other Ambulatory Visit: Payer: Self-pay

## 2021-06-21 DIAGNOSIS — M6281 Muscle weakness (generalized): Secondary | ICD-10-CM | POA: Diagnosis not present

## 2021-06-21 DIAGNOSIS — R42 Dizziness and giddiness: Secondary | ICD-10-CM | POA: Diagnosis not present

## 2021-06-21 DIAGNOSIS — R2689 Other abnormalities of gait and mobility: Secondary | ICD-10-CM

## 2021-06-21 DIAGNOSIS — R2681 Unsteadiness on feet: Secondary | ICD-10-CM

## 2021-06-21 NOTE — Patient Instructions (Signed)
Gaze Stabilization: Standing PARTIAL TANDEM     Feet in partial heel-toe position, keeping eyes on target on wall 3 feet away, tilt head down 15-30 and move head side to side for 60 seconds. Repeat while moving head up and down for 60 seconds. Do 2-3 sessions per day. Repeat using target on pattern background.  Copyright  VHI. All rights reserved.

## 2021-06-21 NOTE — Therapy (Signed)
[facility] [address] [place], [place], [address] Phone: [phone]   Fax:  [phone]  Physical Therapy Treatment/Discharge Summary  Patient Details  Name: [place] MRN: [id number] Date of Birth: [birth year] Referring Provider (PT): Melida Quitter, MD  PHYSICAL THERAPY DISCHARGE SUMMARY  Visits from Start of Care: 15  Current functional level related to goals / functional outcomes: See Clinical Impression Statement   Remaining deficits: Hearing Loss, Mild Imbalance   Education / Equipment: HEP   Patient agrees to discharge. Patient goals were met. Patient is being discharged due to meeting the stated rehab goals.   Encounter Date: 06/21/2021   PT End of Session - 06/21/21 1018     Visit Number 15    Number of Visits 18    Date for PT Re-Evaluation 06/25/21    Authorization Type Medicare + BCBS    Progress Note Due on Visit 44    PT Start Time 1016    PT Stop Time 1057    PT Time Calculation (min) 41 min    Activity Tolerance Patient tolerated treatment well    Behavior During Therapy WFL for tasks assessed/performed             Past Medical History:  Diagnosis Date   Arthritis    Back pain    Dyspnea    ED (erectile dysfunction)    GERD (gastroesophageal reflux disease)    Hemorrhoids    Hyperlipidemia    Insomnia    Nocturia    PE (pulmonary embolism)    Rhinitis    Shoulder pain, bilateral    Urinary hesitancy     Past Surgical History:  Procedure Laterality Date   CARDIOVERSION N/A 11/16/2014   Procedure: CARDIOVERSION;  Surgeon: Sanda Klein, MD;  Location: [facility];  Service: Cardiovascular;  Laterality: N/A;   COLONOSCOPY     TONSILLECTOMY AND ADENOIDECTOMY     VASECTOMY  1989    There were no vitals filed for this visit.   Subjective Assessment - 06/21/21 1020     Subjective Reports still waiting to hear a price back for cochlear implants. No other new  changes/complaints.    Patient is accompained by: Family member   [name]   Pertinent History Arthritis, Dyspnea, GERD, HLD, History of PE    Limitations Walking;Standing    Patient Stated Goals Get Rid of the Vertigo    Currently in Pain? No/denies                [facility] PT Assessment - 06/21/21 0001       Assessment   Medical Diagnosis Vertigo    Referring Provider (PT) Melida Quitter, MD    Onset Date/Surgical Date 02/07/21      Observation/Other Assessments   Focus on Therapeutic Outcomes (FOTO)  DFS: 56 (goal level); DPS: 60.7      Functional Gait  Assessment   Gait assessed  Yes    Gait Level Surface Walks 20 ft in less than 5.5 sec, no assistive devices, good speed, no evidence for imbalance, normal gait pattern, deviates no more than 6 in outside of the 12 in walkway width.    Change in Gait Speed Able to smoothly change walking speed without loss of balance or gait deviation. Deviate no more than 6 in outside of the 12 in walkway width.    Gait with Horizontal Head Turns Performs head turns smoothly with no change in gait. Deviates no more than 6 in outside  12 in walkway width    Gait with Vertical Head Turns Performs head turns with no change in gait. Deviates no more than 6 in outside 12 in walkway width.    Gait and Pivot Turn Pivot turns safely within 3 sec and stops quickly with no loss of balance.    Step Over Obstacle Is able to step over 2 stacked shoe boxes taped together (9 in total height) without changing gait speed. No evidence of imbalance.    Gait with Narrow Base of Support Is able to ambulate for 10 steps heel to toe with no staggering.    Gait with Eyes Closed Walks 20 ft, uses assistive device, slower speed, mild gait deviations, deviates 6-10 in outside 12 in walkway width. Ambulates 20 ft in less than 9 sec but greater than 7 sec.    Ambulating Backwards Walks 20 ft, no assistive devices, good speed, no evidence for imbalance, normal gait    Steps  Alternating feet, no rail.    Total Score 29    FGA comment: 29/30              Vestibular Assessment - 06/21/21 0001       Visual Acuity   Static 8    Dynamic 5   able to read first half of 6           Reviewed HEP:  Access Code: [email or web address] URL: [email or web address] Date: 06/21/2021 Prepared by: Baldomero Lamy  Exercises Tandem Walking - 1 x daily - 7 x weekly - 1 sets - 3-4 reps Standing Gaze Stabilization with Head Rotation and Horizontal Arm Movement - 1 x daily - 7 x weekly - 1 sets - 2 reps - 60 secs hold Walking with Eyes Closed and Counter Support - 1 x daily - 7 x weekly - 4 sets Feet Together Balance with Head Nods on Foam Pad - 1 x daily - 7 x weekly - 1 sets - 10 reps Romberg Stance on Foam Pad with Head Rotation - 1 x daily - 7 x weekly - 1 sets - 10 reps Walking Gaze Stabilization Head Rotation - 1 x daily - 7 x weekly - 1 sets - 60 second hold   Provided Updated VOR HEP:    Gaze Stabilization: Standing PARTIAL TANDEM     Feet in partial heel-toe position, keeping eyes on target on wall 3 feet away, tilt head down 15-30 and move head side to side for 60 seconds. Repeat while moving head up and down for 60 seconds. Do 2-3 sessions per day. Repeat using target on pattern background.  Copyright  VHI. All rights reserved.       PT Education - 06/21/21 1104     Education Details progress toward LTGs; d/c this visit; updated HEP    Person(s) Educated Patient    Methods Explanation;Demonstration;Handout    Comprehension Returned demonstration;Verbalized understanding              PT Short Term Goals - 03/31/21 1914       PT SHORT TERM GOAL #1   Title = LTGs               PT Long Term Goals - 06/21/21 1036       PT LONG TERM GOAL #1   Title Patient will be independent with progressed vestibular/balance HEP    Baseline updated HEP today; reports independence with final HEP    Time 4    Period Weeks    Status  Achieved    Target Date 06/25/21      PT LONG TERM GOAL #2   Title Patient will improve FGA to >/= 28/30    Baseline 27/30; 29/30    Time 4    Period Weeks    Status Achieved    Target Date 06/25/21      PT LONG TERM GOAL #3   Title Patient will improve situation 4 of M-CTSIB to >/= 25 seconds to demo improved vestibular input for balance    Baseline 30 seconds all 4 conditions    Status Achieved      PT LONG TERM GOAL #4   Title Patient will improve DVA to </= 2 line difference to indicative improved VOR    Baseline no change, still a 4 line difference (8, 4); 3 line difference    Time 4    Period Weeks    Status Partially Met    Target Date 06/25/21      PT LONG TERM GOAL #5   Title Patient will improve DFS >/= 60% and DPS to >/= 60    Baseline DFS: 56% and DPS: 60.7    Time 4    Period Weeks    Status Partially Met    Target Date 06/25/21                   Plan - 06/21/21 1106     Clinical Impression Statement Today's skilled PT session included assesment of patient's progress toward LTGs. Patient able to meet/partially meet all LTGs demonstrating improved VOR, improved balance, and reduced fall risk. Patient still continues to demo 3 line idfference on DVA, but improvements noted (prior 4 line difference). With FGA patient scoring 29/30 demonstrating no significant fall risk. Rest of session reviewed HEP and continued to progress activities as tolerated. Patient demo readiness to d/c from PT services at this time, with patient agreeable.    Personal Factors and Comorbidities Comorbidity 3+;Age    Comorbidities Arthritis, Dyspnea, GERD, HLD, History of PE, A-Fib    Examination-Activity Limitations Locomotion Level;Transfers;Bend;Stairs    Examination-Participation Restrictions Community Activity;Occupation;Yard Work;Driving    Rehab Potential Good    PT Frequency 1x / week    PT Duration 8 weeks    PT Treatment/Interventions ADLs/Self Care Home  Management;Aquatic Therapy;Canalith Repostioning;Electrical Stimulation;Moist Heat;Cryotherapy;DME Instruction;Gait training;Stair training;Functional mobility training;Therapeutic activities;Therapeutic exercise;Balance training;Neuromuscular re-education;Patient/family education;Vestibular    PT Next Visit Plan d/c this visit    PT [place] [id number]    Consulted and Agree with Plan of Care Patient             Patient will benefit from skilled therapeutic intervention in order to improve the following deficits and impairments:  Abnormal gait, Difficulty walking, Dizziness, Decreased activity tolerance, Decreased endurance, Decreased balance, Decreased strength  Visit Diagnosis: Dizziness and giddiness  Unsteadiness on feet  Other abnormalities of gait and mobility  Muscle weakness (generalized)     Problem List Patient Active Problem List   Diagnosis Date Noted   History of retinal detachment 05/03/2020   History of vitrectomy 05/03/2020   Macular pucker, left eye 05/03/2020   Intermediate stage nonexudative age-related macular degeneration of both eyes 10/30/2019   Longstanding persistent atrial fibrillation ([place])    Paroxysmal atrial fibrillation ([place]) 10/12/2014    Jones Bales, PT, DPT 06/21/2021, 11:12 AM  [facility] [address] [place], [place], [address] Phone: [phone]   Fax:  [phone]  Name: [place]  MAYSIN CARSTENS MRN: [id number] Date of Birth: 04/28/1951

## 2021-06-28 ENCOUNTER — Other Ambulatory Visit (HOSPITAL_COMMUNITY): Payer: Self-pay | Admitting: Nurse Practitioner

## 2021-06-28 ENCOUNTER — Other Ambulatory Visit (HOSPITAL_COMMUNITY): Payer: Self-pay | Admitting: *Deleted

## 2021-06-28 MED ORDER — FLECAINIDE ACETATE 100 MG PO TABS: 100.0000 mg | ORAL_TABLET | Freq: Two times a day (BID) | ORAL | 0 refills | Status: DC

## 2021-07-04 DIAGNOSIS — R7989 Other specified abnormal findings of blood chemistry: Secondary | ICD-10-CM | POA: Diagnosis not present

## 2021-07-04 DIAGNOSIS — E785 Hyperlipidemia, unspecified: Secondary | ICD-10-CM | POA: Diagnosis not present

## 2021-07-04 DIAGNOSIS — I480 Paroxysmal atrial fibrillation: Secondary | ICD-10-CM | POA: Diagnosis not present

## 2021-07-04 DIAGNOSIS — Z125 Encounter for screening for malignant neoplasm of prostate: Secondary | ICD-10-CM | POA: Diagnosis not present

## 2021-07-11 DIAGNOSIS — J309 Allergic rhinitis, unspecified: Secondary | ICD-10-CM | POA: Diagnosis not present

## 2021-07-11 DIAGNOSIS — I480 Paroxysmal atrial fibrillation: Secondary | ICD-10-CM | POA: Diagnosis not present

## 2021-07-11 DIAGNOSIS — Z0000 Encounter for general adult medical examination without abnormal findings: Secondary | ICD-10-CM | POA: Diagnosis not present

## 2021-07-11 DIAGNOSIS — E785 Hyperlipidemia, unspecified: Secondary | ICD-10-CM | POA: Diagnosis not present

## 2021-07-11 DIAGNOSIS — Z7901 Long term (current) use of anticoagulants: Secondary | ICD-10-CM | POA: Diagnosis not present

## 2021-07-11 DIAGNOSIS — K219 Gastro-esophageal reflux disease without esophagitis: Secondary | ICD-10-CM | POA: Diagnosis not present

## 2021-07-11 DIAGNOSIS — Z1331 Encounter for screening for depression: Secondary | ICD-10-CM | POA: Diagnosis not present

## 2021-07-11 DIAGNOSIS — Z1212 Encounter for screening for malignant neoplasm of rectum: Secondary | ICD-10-CM | POA: Diagnosis not present

## 2021-07-11 DIAGNOSIS — G4700 Insomnia, unspecified: Secondary | ICD-10-CM | POA: Diagnosis not present

## 2021-07-11 DIAGNOSIS — R8281 Pyuria: Secondary | ICD-10-CM | POA: Diagnosis not present

## 2021-07-11 DIAGNOSIS — H8101 Meniere's disease, right ear: Secondary | ICD-10-CM | POA: Diagnosis not present

## 2021-07-11 DIAGNOSIS — Z1339 Encounter for screening examination for other mental health and behavioral disorders: Secondary | ICD-10-CM | POA: Diagnosis not present

## 2021-07-28 ENCOUNTER — Other Ambulatory Visit (HOSPITAL_COMMUNITY): Payer: Self-pay | Admitting: Nurse Practitioner

## 2021-08-16 ENCOUNTER — Telehealth: Payer: Self-pay

## 2021-08-16 DIAGNOSIS — I480 Paroxysmal atrial fibrillation: Secondary | ICD-10-CM

## 2021-08-16 DIAGNOSIS — Z79899 Other long term (current) drug therapy: Secondary | ICD-10-CM

## 2021-08-16 DIAGNOSIS — Z01812 Encounter for preprocedural laboratory examination: Secondary | ICD-10-CM

## 2021-08-16 NOTE — Telephone Encounter (Signed)
? ?  Pre-operative Risk Assessment  ?  ?Patient Name: [place]  ?DOB: 05/25/1950 ?MRN: [id number]  ? ?  ? ?Request for Surgical Clearance   ? ?Procedure:   Left Functional Endoscopic Sinus Surgery with Fusion  ? ?Date of Surgery:  Clearance 09/16/21                              ?   ?Surgeon:  Dr. Melida Quitter ?Surgeon's Group or Practice Name:  [facility] [place] ?Phone number:  [phone] ?Fax number:  [phone] ?  ?Type of Clearance Requested:   ?- Medical  ?- Pharmacy:  Hold Apixaban (Eliquis) How many days prior to surgery can patient come off medication? When can patient resume medication? ?  ?Type of Anesthesia:  General  ?  ?Additional requests/questions:   ? ?Signed, ?Macauley Mossberg   ?08/16/2021, 4:01 PM  ? ?

## 2021-08-16 NOTE — Telephone Encounter (Signed)
? ?  Name: LUCIFER SOJA  ?DOB: 12/01/1950  ?MRN: [id number] ? ?Primary Cardiologist: None ? ?Chart reviewed as part of pre-operative protocol coverage. Because of Yerachmiel Spinney Sattler's past medical history and time since last visit, he will require a follow-up visit in order to better assess preoperative cardiovascular risk. ? ?The patient has not seen a [facility] team member since 2016 (Dr. Caryl Comes), as they have been exclusively followed by [facility] clinic since that time. Afib clinic providers historically do not provide cardiac clearance for procedures therefore needs an office visit to establish care. ? ?Pre-op covering staff: ?- Please schedule appointment and call patient to inform them. Reasonable to schedule with EP as they otherwise do not have any general cardiology issues listed. Remotely saw Dr. Caryl Comes. ?- Please contact requesting surgeon's office via preferred method (i.e, phone, fax) to inform them of need for appointment prior to surgery. ? ?This message will be routed to pharmacy pool so that preliminary recommendations about holding Eliquis will be available to the clearing provider at time of patient's appointment.  ? ?Charlie Pitter, PA-C  ?08/16/2021, 5:13 PM  ? ?

## 2021-08-17 NOTE — Telephone Encounter (Signed)
Msg sent to chart prep, needs new patient appointment to re-establish with Dr Caryl Comes. ?

## 2021-08-17 NOTE — Telephone Encounter (Signed)
Patient with diagnosis of A Fib on Eliquis for anticoagulation.   ? ?Procedure:   Left Functional Endoscopic Sinus Surgery with Fusion  ?  ?Date of procedure: 09/16/21 ? ?Difficult to determine patients accurate risk score using CHADS-VASc score since patient has not been seen by cardiologist since 2016.  Has been being followed at [facility] for hearing loss but do not see any PCP notes or anything explaining PMH.  Will have to wait until after seen by cardiology before clearing patient. ? ?

## 2021-08-19 ENCOUNTER — Other Ambulatory Visit (HOSPITAL_COMMUNITY): Payer: Self-pay | Admitting: Nurse Practitioner

## 2021-08-22 ENCOUNTER — Encounter: Payer: Self-pay | Admitting: Internal Medicine

## 2021-08-22 ENCOUNTER — Ambulatory Visit (INDEPENDENT_AMBULATORY_CARE_PROVIDER_SITE_OTHER): Payer: Medicare Other | Admitting: Internal Medicine

## 2021-08-22 VITALS — BP 118/70 | HR 46 | Ht 68.0 in | Wt 150.8 lb

## 2021-08-22 DIAGNOSIS — I2699 Other pulmonary embolism without acute cor pulmonale: Secondary | ICD-10-CM | POA: Insufficient documentation

## 2021-08-22 DIAGNOSIS — I480 Paroxysmal atrial fibrillation: Secondary | ICD-10-CM | POA: Diagnosis not present

## 2021-08-22 NOTE — Patient Instructions (Addendum)
Medication Instructions:  Your physician recommends that you continue on your current medications as directed. Please refer to the Current Medication list given to you today.  Labwork: None ordered.  Testing/Procedures: None ordered.  Follow-Up: Your physician wants you to follow-up in: as needed.   Any Other Special Instructions Will Be Listed Below (If Applicable).  If you need a refill on your cardiac medications before your next appointment, please call your pharmacy.    

## 2021-08-22 NOTE — Progress Notes (Signed)
? ? ? ? ?ELECTROPHYSIOLOGY CONSULT NOTE  ?Patient ID: Robert Hart, MRN: [id number], DOB/AGE: Apr 17, 1951 71 y.o. ?Admit date: (Not on file) ?Date of Consult: 08/22/2021 ? ?Primary Physician: Donnajean Lopes, MD ?Primary Cardiologist: new ?  ?  ?Robert Hart is a 71 y.o. male who is being seen today for the evaluation of preoperative eval at the request of Dr Thornell Mule.  ? ? ?HPI ?Robert Hart is a 71 y.o. male referred for preoperative consultation.  He has need of a cochlear implant as well as sinus surgery. ? ?The patient denies chest pain, shortness of breath, nocturnal dyspnea, orthopnea or peripheral edema.  There have been no palpitations, lightheadedness or syncope.  He is able to climb the stairs and the incline at [facility] carrying what he needs to up and down.  No chest pain or shortness of breath.  No edema.. ? ?Last seen by me 2016 and has been managed on flecainide long-term since 2016 when it was started in anticipation of cardioversion following a failed cardioversion. ? ?No interval atrial fibrillation of which he is aware.  Had previous discussions about down titration of his flecainide; however, he was disinclined ? 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 ?DATE TEST EF   ?5/16 Echo   55-60 %   ?     ? ?Date Cr K Hgb  ?2/22 1.13 4.2 12.4   ?2/23  4.0    ? ?DATE PR interval QRSduration Dose  ?7/16  146 94 0  ?4/23 180 104 100  ? ? ?Thromboembolic risk factors ( age -26, TIA/CVA-2) for a CHADSVASc Score of >=3 ? ?History of pulmonary embolism ?Past Medical History:  ?Diagnosis Date  ? A-fib ([place])   ? Arthritis   ? Back pain   ? Dyspnea   ? ED (erectile dysfunction)   ? GERD (gastroesophageal reflux disease)   ? Hearing loss   ? Hemorrhoids   ? Hyperlipidemia   ? Insomnia   ? Nocturia   ? PSA elevation   ? Rhinitis   ? Shoulder pain, bilateral   ? Urinary hesitancy   ? Vertigo   ? Vestibular neuropathy, right   ?    ? ?Surgical History:  ?Past Surgical History:  ?Procedure Laterality Date  ? CARDIOVERSION N/A 11/16/2014  ? Procedure: CARDIOVERSION;  Surgeon: Sanda Asah Lamay, MD;  Location: [facility];  Service: Cardiovascular;  Laterality: N/A;  ? CATARACT EXTRACTION    ? COLONOSCOPY    ? DENTAL IMPLANT    ? RETINAL DETACHMENT SURGERY    ? TONSILLECTOMY AND ADENOIDECTOMY    ? VASECTOMY  05/23/1987  ?  ? ?Home Meds: ?Current Meds  ?Medication Sig  ? ELIQUIS 5 MG TABS tablet Take 1 tablet (5 mg total) by mouth 2 (two) times daily. appt req for refill [phone]  ? flecainide (TAMBOCOR) 100 MG tablet TAKE ONE TABLET BY MOUTH TWICE A DAY  ? ketorolac (ACULAR) 0.5 % ophthalmic solution INSTILL 1 DROP INTO THE RIGHT EYE FOUR TIMES A DAY  ? metoprolol succinate (TOPROL-XL) 25 MG 24 hr tablet TAKE 1/2 TABLET BY MOUTH ONCE DAILY.Appointment Required For Further Refills [phone]  ? Multiple Vitamins-Minerals (MULTIVITAMIN & MINERAL PO) Take 1 tablet by mouth daily.  ? pravastatin (PRAVACHOL) 40 MG tablet Take 1 tablet (40 mg total) by mouth daily.  ? traMADol (ULTRAM) 50 MG tablet Take 50 mg by mouth every 6 (six) hours as  needed for moderate pain or severe pain.   ? traZODone (DESYREL) 100 MG tablet Take 100 mg by mouth at bedtime.   ? ? ?Allergies: No Known Allergies ? ?Social History  ? ?Socioeconomic History  ? Marital status: Married  ?  Spouse name: Not on file  ? Number of children: Not on file  ? Years of education: Not on file  ? Highest education level: Not on file  ?Occupational History  ? Not on file  ?Tobacco Use  ? Smoking status: Never  ? Smokeless tobacco: Never  ?Substance and Sexual Activity  ? Alcohol use: Yes  ?  Alcohol/week: 1.0 - 2.0 standard drink  ?  Types: 1 - 2 Standard drinks or equivalent per week  ? Drug use: Never  ? Sexual activity: Not on file  ?Other Topics Concern  ? Not on file  ?Social History Narrative  ? Not on file  ? ?Social Determinants of Health  ? ?Financial Resource Strain: Not on file   ?Food Insecurity: Not on file  ?Transportation Needs: Not on file  ?Physical Activity: Not on file  ?Stress: Not on file  ?Social Connections: Not on file  ?Intimate Partner Violence: Not on file  ?  ? ?Family History  ?Problem Relation Age of Onset  ? Breast cancer Mother   ? Prostate cancer Father   ? COPD Father   ? Lung disease Father   ?  ? ?ROS:  Please see the history of present illness.     All other systems reviewed and negative.  ? ? ?Physical Exam:  ?Blood pressure 118/70, pulse (!) 46, height '5\' 8"'$  (1.727 m), weight 150 lb 12.8 oz (68.4 kg), SpO2 95 %. ?General: Well developed, well nourished male in no acute distress. ?Head: Normocephalic, atraumatic, sclera non-icteric, no xanthomas, nares are without discharge. ?EENT: normal  ?Lymph Nodes:  none ?Neck: Negative for carotid bruits. JVD not elevated. ?Back:without scoliosis kyphosis ?Lungs: Clear bilaterally to auscultation without wheezes, rales, or rhonchi. Breathing is unlabored. ?Heart: RRR with S1 S2. No  murmur . No rubs, or gallops appreciated. ?Abdomen: Soft, non-tender, non-distended with normoactive bowel sounds. No hepatomegaly. No rebound/guarding. No obvious abdominal masses. ?Msk:  Strength and tone appear normal for age. ?Extremities: No clubbing or cyanosis. No  edema.  Distal pedal pulses are 2+ and equal bilaterally. ?Skin: Warm and Dry ?Neuro: Alert and oriented X 3. CN III-XII intact Grossly normal sensory and motor function . ?Psych:  Responds to questions appropriately with a normal affect. ?  ?  ?  ? ?EKG: Sinus at 46 ?Intervals 18/10/50 ? ? ?Assessment and Plan:  ?Persistent atrial fibrillation, however, none x7 years that we know of ? ?Preoperative evaluation ? ?Prior stroke ? ?The patient has had no interval atrial fibrillation of which we are aware over the last 6 or 7 years.  We discussed decreasing his flecainide; however, he is low that he has been doing so well.  His PR/QRS duration lengthening is certainly within range  at approximately 15%.  We will continue his flecainide at 100 twice daily.  He needs adjunctive AV nodal blockade, so we will continue him on his metoprolol 25, his bradycardia seems not to clinically significant. ? ?His functional status is really quite good.  There should be no cardiovascular increased risks for surgery.  The other issue will be anticoagulation, and with his history of stroke, would like to minimize anticoagulation; however, in the absence of atrial fibrillation of any note in the last  years, anticoagulation holding we will need to consider a creatinine clearance.  When he was checked a year ago it was just above 50.  In the event that it remains so then standard 3 dose holding would be reasonable. ? ? ? ? ? ? ? ?Virl Axe ? ?

## 2021-08-25 ENCOUNTER — Ambulatory Visit (HOSPITAL_COMMUNITY): Payer: Medicare Other | Admitting: Nurse Practitioner

## 2021-08-26 NOTE — Addendum Note (Signed)
Addended by: Janan Halter F on: 08/26/2021 02:52 PM ? ? Modules accepted: Orders ? ?

## 2021-08-27 ENCOUNTER — Other Ambulatory Visit (HOSPITAL_COMMUNITY): Payer: Self-pay | Admitting: Nurse Practitioner

## 2021-09-03 ENCOUNTER — Other Ambulatory Visit (HOSPITAL_COMMUNITY): Payer: Self-pay | Admitting: Nurse Practitioner

## 2021-09-05 NOTE — Telephone Encounter (Signed)
Prescription refill request for Eliquis received. ?Indication: PAF ?Last office visit: 08/22/21  Olin Pia MD ?Scr: 1.13 on 2/23/ ?Age: 71 ?Weight: 68.4kg ? ?Based on above findings Eliquis '5mg'$  twice daily is the appropriate dose.  Refill approved x 2.  Past due for labs.  Pt is scheduled for pre-admission labs on 09/13/21. ? ?

## 2021-09-06 NOTE — Telephone Encounter (Signed)
Dr. Olin Pia note reviewed, overall medically cleared. It looks like we still need labs in order for pharm to be able to calculate the duration of holding, but do not see obtained at [facility]. There are some labs pulling into [facility] from [facility] on 06/2021 with BUN 19, eGFR 59, but no Cr or Hgb listed. Will route back to pharm team to see if these + Dr. Olin Pia note will suffice or if updated labs in our office under Dr. Caryl Comes are needed. ?

## 2021-09-06 NOTE — Telephone Encounter (Signed)
Message has been sent to EP scheduler [place] to reach out to the pt with a NEW PT APPT, see notes. I will send FYI to requesting office the pt will need an new pt appt to re-establish with her cardiologist.  ?

## 2021-09-06 NOTE — Telephone Encounter (Signed)
Spoke with pt and advised of need to schedule lab appointment for further recommendation on holding Eliquis.  Pt verbalizes understanding and appointment scheduled for 09/08/2021.  Order placed.  Pt verbalizes understanding and agrees with current plan. ?

## 2021-09-06 NOTE — Telephone Encounter (Signed)
Disregard the previous notes that the pt needs a new pt appt. Pt was just seen by Dr. Caryl Comes on 08/22/21. I will forward back to pre op provider for final review if the pt has been cleared.  ?

## 2021-09-06 NOTE — Telephone Encounter (Signed)
Carla from the ENT office called stating they need the instructions clarified for how long pt can hold Eliquis. ?

## 2021-09-07 NOTE — Addendum Note (Signed)
Addended by: Thora Lance on: 09/07/2021 02:53 PM ? ? Modules accepted: Orders ? ?

## 2021-09-08 ENCOUNTER — Other Ambulatory Visit: Payer: Medicare Other

## 2021-09-08 DIAGNOSIS — Z01812 Encounter for preprocedural laboratory examination: Secondary | ICD-10-CM | POA: Diagnosis not present

## 2021-09-08 DIAGNOSIS — I480 Paroxysmal atrial fibrillation: Secondary | ICD-10-CM | POA: Diagnosis not present

## 2021-09-08 DIAGNOSIS — Z79899 Other long term (current) drug therapy: Secondary | ICD-10-CM | POA: Diagnosis not present

## 2021-09-08 LAB — CBC
Hematocrit: 39.1 % (ref 37.5–51.0)
Hemoglobin: 13.2 g/dL (ref 13.0–17.7)
MCH: 30.5 pg (ref 26.6–33.0)
MCHC: 33.8 g/dL (ref 31.5–35.7)
MCV: 90 fL (ref 79–97)
Platelets: 223 10*3/uL (ref 150–450)
RBC: 4.33 x10E6/uL (ref 4.14–5.80)
RDW: 13 % (ref 11.6–15.4)
WBC: 6 10*3/uL (ref 3.4–10.8)

## 2021-09-08 LAB — BASIC METABOLIC PANEL
BUN/Creatinine Ratio: 16 (ref 10–24)
BUN: 20 mg/dL (ref 8–27)
CO2: 29 mmol/L (ref 20–29)
Calcium: 9.5 mg/dL (ref 8.6–10.2)
Chloride: 102 mmol/L (ref 96–106)
Creatinine, Ser: 1.23 mg/dL (ref 0.76–1.27)
Glucose: 96 mg/dL (ref 70–99)
Potassium: 4.5 mmol/L (ref 3.5–5.2)
Sodium: 136 mmol/L (ref 134–144)
eGFR: 63 mL/min/{1.73_m2} (ref >59)

## 2021-09-08 NOTE — Telephone Encounter (Addendum)
? ?  Patient Name: Robert Hart  ?DOB: 01-Jun-1950 ?MRN: [id number] ? ?Primary Cardiologist: Dr. Caryl Comes ? ?Chart reviewed as part of pre-operative protocol coverage.  ? ?Cardiac risk stratification was addressed in Dr. Olin Pia recent [place] 08/22/21: "His functional status is really quite good.  There should be no cardiovascular increased risks for surgery.  The other issue will be anticoagulation, and with his history of stroke, would like to minimize anticoagulation; however, in the absence of atrial fibrillation of any note in the last years, anticoagulation holding we will need to consider a creatinine clearance.  When he was checked a year ago it was just above 50.  In the event that it remains so then standard 3 dose holding would be reasonable." Note this was 3 dose, not 3 days. ? ?Since that visit, the patient went on to have updated labs so pharmacy team reviewed request to hold anticoagulation. Per pharmD review of anticoagulation, per office protocol, the final recommendation is that patient can hold Eliquis for 1-2 days prior to procedure. He should resume as soon as safely possible after given his prior stroke. Recommend surgical team relay final pre-procedure instructions upon review of these recommendations. ? ?Will route this bundled recommendation, along with Dr. Olin Pia recent visit, to requesting provider via Epic fax function. Please call with questions. ? ? ?Charlie Pitter, PA-C ?09/08/2021, 3:58 PM ? ? ?

## 2021-09-08 NOTE — Telephone Encounter (Addendum)
Labs are back. ? ?BMET ?   ?Component Value Date/Time  ? NA 136 09/08/2021 0907  ? K 4.5 09/08/2021 0907  ? CL 102 09/08/2021 0907  ? CO2 29 09/08/2021 0907  ? GLUCOSE 96 09/08/2021 0907  ? GLUCOSE 95 07/14/2020 0902  ? BUN 20 09/08/2021 0907  ? CREATININE 1.23 09/08/2021 0907  ? CALCIUM 9.5 09/08/2021 0907  ? EGFR 63 09/08/2021 0907  ? GFRNONAA >60 07/14/2020 0902  ? ?CBC ?   ?Component Value Date/Time  ? WBC 6.0 09/08/2021 0907  ? WBC 6.4 07/14/2020 0902  ? RBC 4.33 09/08/2021 0907  ? RBC 4.04 (L) 07/14/2020 0902  ? HGB 13.2 09/08/2021 0907  ? HCT 39.1 09/08/2021 0907  ? PLT 223 09/08/2021 0907  ? MCV 90 09/08/2021 0907  ? MCH 30.5 09/08/2021 0907  ? MCH 30.7 07/14/2020 0902  ? MCHC 33.8 09/08/2021 0907  ? MCHC 33.4 07/14/2020 0902  ? RDW 13.0 09/08/2021 0907  ? LYMPHSABS 1.2 01/06/2015 1234  ? MONOABS 0.4 01/06/2015 1234  ? EOSABS 0.1 01/06/2015 1234  ? BASOSABS 0.0 01/06/2015 1234  ? ? ? ?Await pharm input on anticoag then clearance is ready to bundle with Dr. Olin Pia recent note. ?

## 2021-09-08 NOTE — Telephone Encounter (Signed)
Patient with diagnosis of afib (no recent episodes) on Eliquis for anticoagulation. Also with hx of prior PE. ? ?Procedure: Left Functional Endoscopic Sinus Surgery with Fusion  ?Date of procedure: 09/16/21 ? ?CHA2DS2-VASc Score = 3  ?This indicates a 3.2% annual risk of stroke. ?The patient's score is based upon: ?CHF History: 0 ?HTN History: 0 ?Diabetes History: 0 ?Stroke History: 2 ?Vascular Disease History: 0 ?Age Score: 1 ?Gender Score: 0 ? ?CrCl 37m/min ?Platelet count 223K ? ?Per office protocol, patient can hold Eliquis for 1-2 days prior to procedure. He should resume as soon as safely possible after given his prior stroke. ?

## 2021-09-12 NOTE — Progress Notes (Addendum)
Surgical Instructions ? ? ? Your procedure is scheduled on April 28. ? Report to [facility] Main Entrance "A" at 8:00 A.M., then check in with the Admitting office. ? Call this number if you have problems the morning of Hart: ? [phone] ? ? If you have any questions prior to your Hart date call [phone]: Open Monday-Friday 8am-4pm ? ? ? Remember: ? Do not eat after midnight the night before your Hart ? ?You may drink clear liquids until 7:00am the morning of your Hart.   ?Clear liquids allowed are: Water, Non-Citrus Juices (without pulp), Carbonated Beverages, Clear Tea, Black Coffee ONLY (NO MILK, CREAM OR POWDERED CREAMER of any kind), and Gatorade ?  ? Take these medicines the morning of Hart with A SIP OF WATER:  ?fexofenadine (ALLEGRA) ?flecainide (TAMBOCOR)  ?ketorolac (ACULAR) 0.5 % eye drops ?metoprolol succinate (TOPROL-XL) ?pravastatin (PRAVACHOL) ?traMADol (ULTRAM)if needed ? ?FOLLOW YOUR SURGEON'S INSTRUCTIONS FOR HOLDING ELIQUIS. If no instructions were given please call your surgeon's office immediately.  ? ?As of today, STOP taking any Aspirin (unless otherwise instructed by your surgeon) Aleve, Naproxen, Ibuprofen, Motrin, Advil, Goody's, BC's, all herbal medications, fish oil, and all vitamins. ? ?         ?Do not wear jewelry or makeup ?Do not wear lotions, powders, perfumes/colognes, or deodorant. ?Do not shave 48 hours prior to Hart.  Men may shave face and neck. ?Do not bring valuables to the hospital. ?Do not wear nail polish, gel polish, artificial nails, or any other type of covering on natural nails (fingers and toes) ?If you have artificial nails or gel coating that need to be removed by a nail salon, please have this removed prior to Hart. Artificial nails or gel coating may interfere with anesthesia's ability to adequately monitor your vital signs. ? ?[facility] is not responsible for any belongings or valuables. .  ? ?Do NOT Smoke (Tobacco/Vaping)  24 hours  prior to your procedure ? ?If you use a CPAP at night, you may bring your mask for your overnight stay. ?  ?Contacts, glasses, hearing aids, dentures or partials may not be worn into Hart, please bring cases for these belongings ?  ?For patients admitted to the hospital, discharge time will be determined by your treatment team. ?  ?Patients discharged the day of Hart will not be allowed to drive home, and someone needs to stay with them for 24 hours. ? ? ?SURGICAL WAITING ROOM VISITATION ?Patients having Hart or a procedure in a hospital may have two support people. ?Children under the age of 40 must have an adult with them who is not the patient. ?They may stay in the waiting area during the procedure and may switch out with other visitors. If the patient needs to stay at the hospital during part of their recovery, the visitor guidelines for inpatient rooms apply. ? ?Please refer to the [facility] website for the visitor guidelines for Inpatients (after your Hart is over and you are in a regular room).  ? ? ? ? ? ?Special instructions:   ? ?Oral Hygiene is also important to reduce your risk of infection.  Remember - BRUSH YOUR TEETH THE MORNING OF Hart WITH YOUR REGULAR TOOTHPASTE ? ? ?Robert Hart ? ?Before Hart, you can play an important role. Because skin is not sterile, your skin needs to be as free of germs as possible. You can reduce the number of germs on your skin by washing with CHG (chlorahexidine gluconate) Soap  before Hart.  CHG is an antiseptic cleaner which kills germs and bonds with the skin to continue killing germs even after washing.   ? ? ?Please do not use if you have an allergy to CHG or antibacterial soaps. If your skin becomes reddened/irritated stop using the CHG.  ?Do not shave (including legs and underarms) for at least 48 hours prior to first CHG shower. It is OK to shave your face. ? ?Please follow these instructions carefully. ?  ? ? Shower the  NIGHT BEFORE Hart and the MORNING OF Hart with CHG Soap.  ? If you chose to wash your hair, wash your hair first as usual with your normal shampoo. After you shampoo, rinse your hair and body thoroughly to remove the shampoo.  Then [employer] and genitals (private parts) with your normal soap and rinse thoroughly to remove soap. ? ?After that Use CHG Soap as you would any other liquid soap. You can apply CHG directly to the skin and wash gently with a scrungie or a clean washcloth.  ? ?Apply the CHG Soap to your body ONLY FROM THE NECK DOWN.  Do not use on open wounds or open sores. Avoid contact with your eyes, ears, mouth and genitals (private parts). Wash Face and genitals (private parts)  with your normal soap.  ? ?Wash thoroughly, paying special attention to the area where your Hart will be performed. ? ?Thoroughly rinse your body with warm water from the neck down. ? ?DO NOT shower/wash with your normal soap after using and rinsing off the CHG Soap. ? ?Pat yourself dry with a CLEAN TOWEL. ? ?Wear CLEAN PAJAMAS to bed the night before Hart ? ?Place CLEAN SHEETS on your bed the night before your Hart ? ?DO NOT SLEEP WITH PETS. ? ? ?Day of Hart: ? ?Take a shower with CHG soap. ?Wear Clean/Comfortable clothing the morning of Hart ?Do not apply any deodorants/lotions.   ?Remember to brush your teeth WITH YOUR REGULAR TOOTHPASTE. ? ? ? ?If you received a COVID test during your pre-op visit, it is requested that you wear a mask when out in public, stay away from anyone that may not be feeling well, and notify your surgeon if you develop symptoms. If you have been in contact with anyone that has tested positive in the last 10 days, please notify your surgeon. ? ?  ?Please read over the following fact sheets that you were given.   ?

## 2021-09-13 ENCOUNTER — Encounter (HOSPITAL_COMMUNITY)
Admission: RE | Admit: 2021-09-13 | Discharge: 2021-09-13 | Disposition: A | Discharge status: Home / Self Care | Payer: Medicare Other | Source: Ambulatory Visit | Attending: Otolaryngology | Admitting: Otolaryngology

## 2021-09-13 ENCOUNTER — Encounter (HOSPITAL_COMMUNITY): Payer: Self-pay

## 2021-09-13 ENCOUNTER — Other Ambulatory Visit: Payer: Self-pay | Admitting: Otolaryngology

## 2021-09-13 ENCOUNTER — Other Ambulatory Visit: Payer: Self-pay

## 2021-09-13 VITALS — BP 115/72 | HR 54 | Temp 97.5°F | Resp 17 | Ht 68.0 in | Wt 150.5 lb

## 2021-09-13 DIAGNOSIS — M549 Dorsalgia, unspecified: Secondary | ICD-10-CM | POA: Diagnosis not present

## 2021-09-13 DIAGNOSIS — Z7901 Long term (current) use of anticoagulants: Secondary | ICD-10-CM | POA: Insufficient documentation

## 2021-09-13 DIAGNOSIS — Z20822 Contact with and (suspected) exposure to covid-19: Secondary | ICD-10-CM | POA: Insufficient documentation

## 2021-09-13 DIAGNOSIS — K219 Gastro-esophageal reflux disease without esophagitis: Secondary | ICD-10-CM | POA: Insufficient documentation

## 2021-09-13 DIAGNOSIS — E785 Hyperlipidemia, unspecified: Secondary | ICD-10-CM | POA: Insufficient documentation

## 2021-09-13 DIAGNOSIS — Z8673 Personal history of transient ischemic attack (TIA), and cerebral infarction without residual deficits: Secondary | ICD-10-CM | POA: Diagnosis not present

## 2021-09-13 DIAGNOSIS — H9190 Unspecified hearing loss, unspecified ear: Secondary | ICD-10-CM | POA: Diagnosis not present

## 2021-09-13 DIAGNOSIS — J322 Chronic ethmoidal sinusitis: Secondary | ICD-10-CM | POA: Diagnosis not present

## 2021-09-13 DIAGNOSIS — Z01818 Encounter for other preprocedural examination: Secondary | ICD-10-CM

## 2021-09-13 DIAGNOSIS — I480 Paroxysmal atrial fibrillation: Secondary | ICD-10-CM | POA: Insufficient documentation

## 2021-09-13 DIAGNOSIS — H8121 Vestibular neuronitis, right ear: Secondary | ICD-10-CM | POA: Diagnosis not present

## 2021-09-13 DIAGNOSIS — I252 Old myocardial infarction: Secondary | ICD-10-CM | POA: Diagnosis not present

## 2021-09-13 DIAGNOSIS — R0600 Dyspnea, unspecified: Secondary | ICD-10-CM | POA: Insufficient documentation

## 2021-09-13 DIAGNOSIS — Z01812 Encounter for preprocedural laboratory examination: Secondary | ICD-10-CM | POA: Diagnosis not present

## 2021-09-13 HISTORY — DX: Cardiac arrhythmia, unspecified: I49.9

## 2021-09-13 LAB — SARS CORONAVIRUS 2 (TAT 6-24 HRS): SARS Coronavirus 2: NEGATIVE

## 2021-09-13 NOTE — Progress Notes (Signed)
PCP - Leanna Battles MD ?Cardiologist - Virl Axe MD ? ?PPM/ICD - denies ?Device Orders -  ?Rep Notified -  ? ?Chest x-ray - na ?EKG - 08/22/21 ?Stress Test - none ?ECHO - 10/13/14 ?Cardiac Cath - none ? ?Sleep Study - Had a home sleep study "years ago",but was not diagnosed with sleep apnea. ?CPAP - no ? ?Fasting Blood Sugar - n/a ?Checks Blood Sugar _____ times a day ? ?Blood Thinner Instructions:pt's last dose of Eliquis was 4/20 per Dr. Iona Beard instructions.  ?Aspirin Instructions:n/a  ? ?ERAS Protcol -clear liquids until 0700 ?PRE-SURGERY Ensure or G2- no ? ?COVID TEST- 09/13/21 ? ? ?Anesthesia review: yes for cardiac history ? ?Patient denies shortness of breath, fever, cough and chest pain at PAT appointment ? ? ?All instructions explained to the patient, with a verbal understanding of the material. Patient agrees to go over the instructions while at home for a better understanding. Patient also instructed to wear a mask when out in public after being tested for COVID-19. The opportunity to ask questions was provided. ?  ?

## 2021-09-14 NOTE — Anesthesia Preprocedure Evaluation (Addendum)
Anesthesia Evaluation  ?Patient identified by MRN, date of birth, ID band ?Patient awake ? ? ? ?Reviewed: ?Allergy & Precautions, NPO status , Patient's Chart, lab work & pertinent test results, reviewed documented beta blocker date and time  ? ?Airway ?Mallampati: I ? ?TM Distance: >3 FB ?Neck ROM: Full ? ? ? Dental ?no notable dental hx. ?(+) Teeth Intact, Caps, Dental Advisory Given ?  ?Pulmonary ?shortness of breath and with exertion,  ?Left frontoethmoid sinusitis ?Nasal polyps ?  ?Pulmonary exam normal ?breath sounds clear to auscultation ? ? ? ? ? ? Cardiovascular ?Normal cardiovascular exam+ dysrhythmias Atrial Fibrillation  ?Rhythm:Regular Rate:Normal ? ?EKG 08/22/21 ?NSR ? ?Atrial fibrillation - on Metoprolol and Flecainide- took Metoprolol last pm and Flecainide this am ?  ?Neuro/Psych ?Right vestibular neuropathy ?Sensorineural hearing loss ? Neuromuscular disease negative psych ROS  ? GI/Hepatic ?Neg liver ROS, GERD  ,  ?Endo/Other  ?Hyperlipidemia ? Renal/GU ?negative Renal ROS  ?negative genitourinary ?  ?Musculoskeletal ? ?(+) Arthritis , Osteoarthritis,   ? Abdominal ?  ?Peds ? Hematology ?Eliquis therapy- last dose 5/24   ?Anesthesia Other Findings ? ? Reproductive/Obstetrics ? ?  ? ? ? ? ? ? ? ? ? ? ? ? ? ?  ?  ? ? ? ? ? ? ?Anesthesia Physical ?Anesthesia Plan ? ?ASA: 3 ? ?Anesthesia Plan: General  ? ?Post-op Pain Management: Ofirmev IV (intra-op)* and Precedex  ? ?Induction: Intravenous ? ?PONV Risk Score and Plan: 4 or greater and Treatment may vary due to age or medical condition, Ondansetron and Dexamethasone ? ?Airway Management Planned: Oral ETT ? ?Additional Equipment: None ? ?Intra-op Plan:  ? ?Post-operative Plan: Extubation in OR ? ?Informed Consent: I have reviewed the patients History and Physical, chart, labs and discussed the procedure including the risks, benefits and alternatives for the proposed anesthesia with the patient or authorized  representative who has indicated his/her understanding and acceptance.  ? ? ? ?Dental advisory given ? ?Plan Discussed with: CRNA and Anesthesiologist ? ?Anesthesia Plan Comments: (PAT note written 09/14/2021 by Myra Gianotti, PA-C. ?)  ? ? ? ? ? ?Anesthesia Quick Evaluation ? ?

## 2021-09-14 NOTE — Progress Notes (Addendum)
Anesthesia Chart Review: SAME DAY WORK-UP ? Case: [id number] Date/Time: 09/16/21 0945  ? Procedure: SINUS ENDO WITH FUSION (Left)  ? Anesthesia type: General  ? Pre-op diagnosis: Chronic ethmoidal sinusitis  ? Location: [facility] OR ROOM 09 / [place] [facility]  ? Surgeons: Melida Quitter, MD  ? ?  ? ? ?DISCUSSION: Patient is a 71 year old male scheduled for the above procedure.  Notes indicate he is also being considered for cochlear implant. ? ?History includes never smoker, afib/PAF (diagnosed 2016, failed DCCV 11/16/14, converted to SR on flecainide), dyspnea, GERD, HLD, CVA (by imaging: old left parietal cortical and subcortical infarct 12/15/20 MRI), right vestibular neuropathy, hearing loss, back pain, dental implant.  ? ?Preoperative input by EP Dr. Caryl Comes on 08/22/21: ?"Persistent atrial fibrillation, however, none x7 years that we know of ?  ?Preoperative evaluation ?  ?Prior stroke ?  ?The patient has had no interval atrial fibrillation of which we are aware over the last 6 or 7 years.  We discussed decreasing his flecainide; however, he is low that he has been doing so well.  His PR/QRS duration lengthening is certainly within range at approximately 15%.  We will continue his flecainide at 100 twice daily.  He needs adjunctive AV nodal blockade, so we will continue him on his metoprolol 25, his bradycardia seems not to clinically significant. ?  ?His functional status is really quite good.  There should be no cardiovascular increased risks for surgery.  The other issue will be anticoagulation, and with his history of stroke, would like to minimize anticoagulation; however, in the absence of atrial fibrillation of any note in the last years, anticoagulation holding we will need to consider a creatinine clearance.  When he was checked a year ago it was just above 50.  In the event that it remains so then standard 3 dose holding would be reasonable." However, patient reported instructions to hold Eliquis starting 09/08/21--although ENT  notation indicated he was instructed to hold for 3 days. ?  ?Anesthesia team to evaluate on the day of surgery.  ? ? ?VS: BP 115/72   Pulse (!) 54   Temp (!) 36.4 ?C (Oral)   Resp 17   Ht '5\' 8"'$  (1.727 m)   Wt 68.3 kg   SpO2 100%   BMI 22.88 kg/m?  ? ? ?PROVIDERS: ?Donnajean Lopes, MD is PCP  ?Virl Axe, MD is EP cardiologist. Also has seen Roderic Palau, NP in the [facility]. ? ?LABS:  Labs from 09/08/21 reviewed. Most recent lab results in [facility] include: ?Lab Results  ?Component Value Date  ? WBC 6.0 09/08/2021  ? HGB 13.2 09/08/2021  ? HCT 39.1 09/08/2021  ? PLT 223 09/08/2021  ? GLUCOSE 96 09/08/2021  ? NA 136 09/08/2021  ? K 4.5 09/08/2021  ? CL 102 09/08/2021  ? CREATININE 1.23 09/08/2021  ? BUN 20 09/08/2021  ? CO2 29 09/08/2021  ? ? ? ?IMAGES: ?CT Temporal Bones 05/19/21 ([facility]): ?IMPRESSION: ?- No evidence of labyrinthitis ossificans or other complicating feature for cochlear implantation.  ?- Persistent left anterior drainage pathway sinus disease, favored odontogenic. ? ?CT ENT 02/02/21 ([facility]): ?IMPRESSION:  ?Left ostiomeatal unit pattern sinusitis with complete opacification  ?of the left frontal sinus, left anterior ethmoid air cells, left  ?maxillary sinus, and left middle meatus. Opacified left  ?frontoethmoidal recess and left maxillary ostium. Given this  ?pattern, obstructing lesion (nonspecific but frequently polyp(s)) is  ?a possibility. Consider correlation with direct inspection.  ? ? ?  EKG: 08/22/21 ([facility]): SB at 46 bpm ? ? ?CV: ?Echo 10/13/14: ?Study Conclusions  ?- Left ventricle: The cavity size was normal. Systolic function was  ?  normal. The estimated ejection fraction was in the range of 55%  ?  to 60%. Wall motion was normal; there were no regional wall  ?  motion abnormalities.  ?- Atrial septum: No defect or patent foramen ovale was identified. ? ?24 Hour Holter monitor 10/13/14: Sinus bradycardia to sinus tachycardia.  Occasional PVCs and PACs.  5-second  supraventricular run. ? ? ?Past Medical History:  ?Diagnosis Date  ? A-fib ([place])   ? Arthritis   ? Back pain   ? Dyspnea   ? Dysrhythmia   ? ED (erectile dysfunction)   ? GERD (gastroesophageal reflux disease)   ? Hearing loss   ? Hemorrhoids   ? Hyperlipidemia   ? Insomnia   ? Nocturia   ? PSA elevation   ? Rhinitis   ? Shoulder pain, bilateral   ? Urinary hesitancy   ? Vertigo   ? Vestibular neuropathy, right   ? ? ?Past Surgical History:  ?Procedure Laterality Date  ? CARDIOVERSION N/A 11/16/2014  ? Procedure: CARDIOVERSION;  Surgeon: Sanda Klein, MD;  Location: [facility]COPY;  Service: Cardiovascular;  Laterality: N/A;  ? CATARACT EXTRACTION    ? COLONOSCOPY    ? DENTAL IMPLANT    ? RETINAL DETACHMENT SURGERY    ? TONSILLECTOMY AND ADENOIDECTOMY    ? VASECTOMY  05/23/1987  ? ? ?MEDICATIONS: ? ELIQUIS 5 MG TABS tablet  ? fexofenadine (ALLEGRA) 180 MG tablet  ? flecainide (TAMBOCOR) 100 MG tablet  ? ketorolac (ACULAR) 0.5 % ophthalmic solution  ? metoprolol succinate (TOPROL-XL) 25 MG 24 hr tablet  ? Multiple Vitamins-Minerals (MULTIVITAMIN & MINERAL PO)  ? Multiple Vitamins-Minerals (PRESERVISION AREDS 2+MULTI VIT PO)  ? pravastatin (PRAVACHOL) 40 MG tablet  ? traMADol (ULTRAM) 50 MG tablet  ? traZODone (DESYREL) 100 MG tablet  ? ?No current facility-administered medications for this encounter.  ? ? ?Myra Gianotti, PA-C ?Surgical Short Stay/Anesthesiology ?[facility] Phone [phone] ?[facility] Phone [phone] ?09/14/2021 12:26 PM ? ? ? ? ? ?

## 2021-09-16 ENCOUNTER — Encounter (HOSPITAL_COMMUNITY)
Admission: RE | Disposition: A | Discharge status: Home / Self Care | Payer: Self-pay | Source: Home / Self Care | Attending: Otolaryngology

## 2021-09-16 ENCOUNTER — Ambulatory Visit (HOSPITAL_COMMUNITY)
Admission: RE | Admit: 2021-09-16 | Discharge: 2021-09-16 | Disposition: A | Discharge status: Home / Self Care | Payer: Medicare Other | Attending: Otolaryngology | Admitting: Otolaryngology

## 2021-09-16 ENCOUNTER — Ambulatory Visit (HOSPITAL_COMMUNITY): Payer: Medicare Other | Admitting: Physician Assistant

## 2021-09-16 ENCOUNTER — Other Ambulatory Visit: Payer: Self-pay

## 2021-09-16 ENCOUNTER — Encounter (HOSPITAL_COMMUNITY): Payer: Self-pay | Admitting: Otolaryngology

## 2021-09-16 ENCOUNTER — Ambulatory Visit (HOSPITAL_BASED_OUTPATIENT_CLINIC_OR_DEPARTMENT_OTHER): Payer: Medicare Other | Admitting: Physician Assistant

## 2021-09-16 DIAGNOSIS — Z79899 Other long term (current) drug therapy: Secondary | ICD-10-CM | POA: Insufficient documentation

## 2021-09-16 DIAGNOSIS — J322 Chronic ethmoidal sinusitis: Secondary | ICD-10-CM | POA: Diagnosis not present

## 2021-09-16 DIAGNOSIS — Z7901 Long term (current) use of anticoagulants: Secondary | ICD-10-CM | POA: Insufficient documentation

## 2021-09-16 DIAGNOSIS — M1990 Unspecified osteoarthritis, unspecified site: Secondary | ICD-10-CM | POA: Diagnosis not present

## 2021-09-16 DIAGNOSIS — E785 Hyperlipidemia, unspecified: Secondary | ICD-10-CM

## 2021-09-16 DIAGNOSIS — J324 Chronic pansinusitis: Secondary | ICD-10-CM | POA: Diagnosis not present

## 2021-09-16 DIAGNOSIS — I4891 Unspecified atrial fibrillation: Secondary | ICD-10-CM

## 2021-09-16 HISTORY — PX: MAXILLARY ANTROSTOMY: SHX2003

## 2021-09-16 HISTORY — PX: SINUS ENDO WITH FUSION: SHX5329

## 2021-09-16 HISTORY — PX: ETHMOIDECTOMY: SHX5197

## 2021-09-16 HISTORY — PX: FRONTAL SINUS EXPLORATION: SHX6591

## 2021-09-16 SURGERY — SURGERY, PARANASAL SINUS, ENDOSCOPIC, WITH NASAL SEPTOPLASTY, TURBINOPLASTY, AND MAXILLARY SINUSOTOMY
Anesthesia: General | Site: Nose | Laterality: Left

## 2021-09-16 MED ORDER — PHENYLEPHRINE 80 MCG/ML (10ML) SYRINGE FOR IV PUSH (FOR BLOOD PRESSURE SUPPORT)
PREFILLED_SYRINGE | INTRAVENOUS | Status: DC | PRN
  Administered 2021-09-16: 160 ug via INTRAVENOUS
  Administered 2021-09-16: 80 ug via INTRAVENOUS
  Administered 2021-09-16: 160 ug via INTRAVENOUS

## 2021-09-16 MED ORDER — EPHEDRINE SULFATE-NACL 50-0.9 MG/10ML-% IV SOSY
PREFILLED_SYRINGE | INTRAVENOUS | Status: DC | PRN
Start: 2021-09-16 — End: 2021-09-16
  Administered 2021-09-16: 10 mg via INTRAVENOUS

## 2021-09-16 MED ORDER — OXYCODONE HCL 5 MG/5ML PO SOLN: 5.0000 mg | Freq: Once | ORAL | Status: AC | PRN

## 2021-09-16 MED ORDER — AMISULPRIDE (ANTIEMETIC) 5 MG/2ML IV SOLN: 10.0000 mg | Freq: Once | INTRAVENOUS | Status: DC | PRN

## 2021-09-16 MED ORDER — ONDANSETRON HCL 4 MG/2ML IJ SOLN: 4.0000 mg | Freq: Once | INTRAMUSCULAR | Status: DC | PRN

## 2021-09-16 MED ORDER — PROPOFOL 10 MG/ML IV BOLUS
INTRAVENOUS | Status: DC | PRN
  Administered 2021-09-16: 140 mg via INTRAVENOUS

## 2021-09-16 MED ORDER — CHLORHEXIDINE GLUCONATE 0.12 % MT SOLN
15.0000 mL | Freq: Once | OROMUCOSAL | Status: AC
  Administered 2021-09-16: 15 mL via OROMUCOSAL
  Filled 2021-09-16: qty 15

## 2021-09-16 MED ORDER — ORAL CARE MOUTH RINSE: 15.0000 mL | Freq: Once | OROMUCOSAL | Status: AC

## 2021-09-16 MED ORDER — PHENYLEPHRINE HCL-NACL 20-0.9 MG/250ML-% IV SOLN
INTRAVENOUS | Status: DC | PRN
  Administered 2021-09-16: 20 ug/min via INTRAVENOUS

## 2021-09-16 MED ORDER — OXYMETAZOLINE HCL 0.05 % NA SOLN
NASAL | Status: DC | PRN
  Administered 2021-09-16: 1 via TOPICAL

## 2021-09-16 MED ORDER — ONDANSETRON HCL 4 MG/2ML IJ SOLN
INTRAMUSCULAR | Status: DC | PRN
  Administered 2021-09-16: 4 mg via INTRAVENOUS

## 2021-09-16 MED ORDER — MIDAZOLAM HCL 2 MG/2ML IJ SOLN
INTRAMUSCULAR | Status: AC
  Filled 2021-09-16: qty 2

## 2021-09-16 MED ORDER — MUPIROCIN 2 % EX OINT
TOPICAL_OINTMENT | CUTANEOUS | Status: AC
  Filled 2021-09-16: qty 22

## 2021-09-16 MED ORDER — DEXAMETHASONE SODIUM PHOSPHATE 10 MG/ML IJ SOLN
INTRAMUSCULAR | Status: DC | PRN
  Administered 2021-09-16: 10 mg via INTRAVENOUS

## 2021-09-16 MED ORDER — SODIUM CHLORIDE 0.9 % IR SOLN
Status: DC | PRN
  Administered 2021-09-16: 1000 mL

## 2021-09-16 MED ORDER — ROCURONIUM BROMIDE 10 MG/ML (PF) SYRINGE
PREFILLED_SYRINGE | INTRAVENOUS | Status: DC | PRN
  Administered 2021-09-16: 60 mg via INTRAVENOUS

## 2021-09-16 MED ORDER — AMOXICILLIN-POT CLAVULANATE 875-125 MG PO TABS: 1.0000 | ORAL_TABLET | Freq: Two times a day (BID) | ORAL | 0 refills | Status: AC

## 2021-09-16 MED ORDER — LIDOCAINE 2% (20 MG/ML) 5 ML SYRINGE
INTRAMUSCULAR | Status: DC | PRN
  Administered 2021-09-16: 60 mg via INTRAVENOUS

## 2021-09-16 MED ORDER — OXYMETAZOLINE HCL 0.05 % NA SOLN
NASAL | Status: AC
  Filled 2021-09-16: qty 30

## 2021-09-16 MED ORDER — GLYCOPYRROLATE 0.2 MG/ML IJ SOLN
INTRAMUSCULAR | Status: DC | PRN
Start: 2021-09-16 — End: 2021-09-16
  Administered 2021-09-16: .2 mg via INTRAVENOUS

## 2021-09-16 MED ORDER — LIDOCAINE-EPINEPHRINE 1 %-1:100000 IJ SOLN
INTRAMUSCULAR | Status: AC
  Filled 2021-09-16: qty 1

## 2021-09-16 MED ORDER — OXYCODONE HCL 5 MG PO TABS
5.0000 mg | ORAL_TABLET | Freq: Once | ORAL | Status: AC | PRN
  Administered 2021-09-16: 5 mg via ORAL

## 2021-09-16 MED ORDER — MIDAZOLAM HCL 2 MG/2ML IJ SOLN
INTRAMUSCULAR | Status: DC | PRN
  Administered 2021-09-16: 2 mg via INTRAVENOUS

## 2021-09-16 MED ORDER — FENTANYL CITRATE (PF) 250 MCG/5ML IJ SOLN
INTRAMUSCULAR | Status: AC
  Filled 2021-09-16: qty 5

## 2021-09-16 MED ORDER — PROPOFOL 10 MG/ML IV BOLUS
INTRAVENOUS | Status: AC
  Filled 2021-09-16: qty 20

## 2021-09-16 MED ORDER — SUGAMMADEX SODIUM 200 MG/2ML IV SOLN
INTRAVENOUS | Status: DC | PRN
  Administered 2021-09-16: 200 mg via INTRAVENOUS

## 2021-09-16 MED ORDER — CEFAZOLIN SODIUM-DEXTROSE 2-3 GM-%(50ML) IV SOLR
INTRAVENOUS | Status: DC | PRN
  Administered 2021-09-16: 2 g via INTRAVENOUS

## 2021-09-16 MED ORDER — FENTANYL CITRATE (PF) 250 MCG/5ML IJ SOLN
INTRAMUSCULAR | Status: DC | PRN
  Administered 2021-09-16: 150 ug via INTRAVENOUS

## 2021-09-16 MED ORDER — HYDROCODONE-ACETAMINOPHEN 7.5-325 MG PO TABS: 1.0000 | ORAL_TABLET | Freq: Four times a day (QID) | ORAL | 0 refills | Status: DC | PRN

## 2021-09-16 MED ORDER — PHENYLEPHRINE HCL (PRESSORS) 10 MG/ML IV SOLN
INTRAVENOUS | Status: AC
  Filled 2021-09-16: qty 1

## 2021-09-16 MED ORDER — OXYCODONE HCL 5 MG PO TABS
ORAL_TABLET | ORAL | Status: AC
  Filled 2021-09-16: qty 1

## 2021-09-16 MED ORDER — FENTANYL CITRATE (PF) 100 MCG/2ML IJ SOLN: 25.0000 ug | INTRAMUSCULAR | Status: DC | PRN

## 2021-09-16 MED ORDER — MUPIROCIN 2 % EX OINT
TOPICAL_OINTMENT | CUTANEOUS | Status: DC | PRN
  Administered 2021-09-16: 1 via NASAL

## 2021-09-16 MED ORDER — LIDOCAINE-EPINEPHRINE 1 %-1:100000 IJ SOLN
INTRAMUSCULAR | Status: DC | PRN
  Administered 2021-09-16: 3 mL

## 2021-09-16 MED ORDER — LACTATED RINGERS IV SOLN: INTRAVENOUS | Status: DC

## 2021-09-16 SURGICAL SUPPLY — 50 items
ATTRACTOMAT 16X20 MAGNETIC DRP (DRAPES) IMPLANT
BAG COUNTER SPONGE SURGICOUNT (BAG) ×2 IMPLANT
BLADE RAD40 ROTATE 4M 4 5PK (BLADE) IMPLANT
BLADE RAD60 ROTATE M4 4 5PK (BLADE) ×1 IMPLANT
BLADE ROTATE TRICUT 4X13 M4 (BLADE) ×1 IMPLANT
BLADE SURG 15 STRL LF DISP TIS (BLADE) IMPLANT
BLADE SURG 15 STRL SS (BLADE)
BLADE TRICUT ROTATE M4 4 5PK (BLADE) ×2 IMPLANT
CANISTER SUCT 3000ML PPV (MISCELLANEOUS) ×2 IMPLANT
CLSR STERI-STRIP ANTIMIC 1/2X4 (GAUZE/BANDAGES/DRESSINGS) ×2 IMPLANT
COAGULATOR SUCT SWTCH 10FR 6 (ELECTROSURGICAL) IMPLANT
DRAPE HALF SHEET 40X57 (DRAPES) IMPLANT
DRESSING NASAL POPE 10X1.5X2.5 (GAUZE/BANDAGES/DRESSINGS) IMPLANT
DRSG NASAL POPE 10X1.5X2.5 (GAUZE/BANDAGES/DRESSINGS)
DRSG NASOPORE 8CM (GAUZE/BANDAGES/DRESSINGS) ×1 IMPLANT
DRSG TELFA 3X8 NADH (GAUZE/BANDAGES/DRESSINGS) IMPLANT
ELECT REM PT RETURN 9FT ADLT (ELECTROSURGICAL)
ELECTRODE REM PT RTRN 9FT ADLT (ELECTROSURGICAL) IMPLANT
FILTER ARTHROSCOPY CONVERTOR (FILTER) ×2 IMPLANT
GLOVE BIO SURGEON STRL SZ7.5 (GLOVE) ×2 IMPLANT
GOWN STRL REUS W/ TWL LRG LVL3 (GOWN DISPOSABLE) ×2 IMPLANT
GOWN STRL REUS W/TWL LRG LVL3 (GOWN DISPOSABLE) ×4
KIT BASIN OR (CUSTOM PROCEDURE TRAY) ×2 IMPLANT
KIT TURNOVER KIT B (KITS) ×2 IMPLANT
NDL HYPO 25GX1X1/2 BEV (NEEDLE) IMPLANT
NDL PRECISIONGLIDE 27X1.5 (NEEDLE) ×1 IMPLANT
NDL SPNL 22GX3.5 QUINCKE BK (NEEDLE) ×1 IMPLANT
NEEDLE HYPO 25GX1X1/2 BEV (NEEDLE) IMPLANT
NEEDLE PRECISIONGLIDE 27X1.5 (NEEDLE) ×2 IMPLANT
NEEDLE SPNL 22GX3.5 QUINCKE BK (NEEDLE) ×2 IMPLANT
NS IRRIG 1000ML POUR BTL (IV SOLUTION) ×2 IMPLANT
PAD ARMBOARD 7.5X6 YLW CONV (MISCELLANEOUS) ×4 IMPLANT
PAD DRESSING TELFA 3X8 NADH (GAUZE/BANDAGES/DRESSINGS) IMPLANT
PAD ENT ADHESIVE 25PK (MISCELLANEOUS) ×2 IMPLANT
PATTIES SURGICAL .5 X3 (DISPOSABLE) ×2 IMPLANT
POSITIONER HEAD DONUT 9IN (MISCELLANEOUS) IMPLANT
SHEATH ENDOSCRUB 0 DEG (SHEATH) ×2 IMPLANT
SHEATH ENDOSCRUB 30 DEG (SHEATH) ×2 IMPLANT
SHEATH ENDOSCRUB 45 DEG (SHEATH) ×1 IMPLANT
SOL ANTI FOG 6CC (MISCELLANEOUS) ×1 IMPLANT
SOLUTION ANTI FOG 6CC (MISCELLANEOUS) ×1
SUT ETHILON 3 0 PS 1 (SUTURE) IMPLANT
SWAB COLLECTION DEVICE MRSA (MISCELLANEOUS) IMPLANT
SYR 50ML SLIP (SYRINGE) IMPLANT
TRACKER ENT INSTRUMENT (MISCELLANEOUS) IMPLANT
TRACKER ENT PATIENT (MISCELLANEOUS) IMPLANT
TRAY ENT MC OR (CUSTOM PROCEDURE TRAY) ×2 IMPLANT
TUBE CONNECTING 12X1/4 (SUCTIONS) ×2 IMPLANT
TUBING STRAIGHTSHOT EPS 5PK (TUBING) IMPLANT
WATER STERILE IRR 1000ML POUR (IV SOLUTION) ×2 IMPLANT

## 2021-09-16 NOTE — Op Note (Signed)
PREOPERATIVE DIAGNOSIS:  Chronic left pansinusitis ?  ?POSTOPERATIVE DIAGNOSIS:  Chronic left pansinusitis ?  ?PROCEDURE:  Left anterior ethmoidectomy, left frontal sinus exploration, left maxillary antrostomy, Fusion image guidance ?  ?SURGEON:  Melida Quitter, MD ?  ?ANESTHESIA:  General endotracheal anesthesia ?  ?COMPLICATIONS:  None ?  ?INDICATIONS:  The patient is a 71 year old male with a history of chronic or recurrent sinusitis with limited response to medical therapy.  He presents to the operating room for surgical management. ?  ?FINDINGS:  Edematous mucosa of the anterior ethmoid.  Pus filling the maxillary sinus.  Culture swabs sent.  Frontal recess edematous. ?  ?DESCRIPTION OF PROCEDURE:  The patient was identified in the holding room, informed consent having been obtained including discussion of risks, benefits and alternatives, the patient was brought to the operative suite and put the operative table in  ?supine position.  Anesthesia was induced and the patient was intubated by the anesthesia team without difficulty.  The eyes were lubricated and the Fusion antenna was placed.  The patient was given intravenous antibiotics during the case.  The face was prepped and draped in sterile fashion.  The patient was registered to the Fusion system in the standard fashion.  The eyes were taped closed and Afrin-soaked pledgets were placed in both sides of the nose.  After registering instruments, the pledgets were removed and the left nasal passage was inspected with a straight telescope.  The lateral nasal wall was injected with local anesthetic.  The middle turbinate was medialized and the uncinate process was elevated from the side wall.  The uncinate was divided and removed using the microdebrider.  An angled telescope was used to evaluate the maxillary opening that was widened posteriorly and inferiorly.  Culture swabs were used to collect from within the maxillary sinus where pus was encountered.  The  sinus was suctioned and irrigated.  The straight telescope was then used to evaluate the ethmoid.  The ethmoid bulla and anterior cells were then removed using the microdebrider.  Image guidance was used to assist.  The anterior skull base was then inspected with an angled telescope.  Septations were removed using the microdebrider in a retrograde fashion into the frontal recess.  Image guidance was used.  The frontal recess was then dissected of septations using the microdebrider.  The natural frontal recess was identified and surround septations removed leaving a widely patent pathway.  Image guidance was used.   ? ?Half of a Nasopore pack was then placed in the ethmoid sinus coated with mupirocin ointment.  The pack was saturated with saline.  Nasal passages and the throat were suctioned.   Drapes were removed and the patient was cleaned off.  The patient was returned to anesthesia for wakeup, extubated, and taken to the recovery room in stable condition. ? ?

## 2021-09-16 NOTE — Anesthesia Procedure Notes (Signed)
Procedure Name: Intubation ?Date/Time: 09/16/2021 11:23 AM ?Performed by: Vonna Drafts, CRNA ?Pre-anesthesia Checklist: Patient identified, Emergency Drugs available, Suction available and Patient being monitored ?Patient Re-evaluated:Patient Re-evaluated prior to induction ?Oxygen Delivery Method: Circle system utilized ?Preoxygenation: Pre-oxygenation with 100% oxygen ?Induction Type: IV induction ?Ventilation: Mask ventilation without difficulty ?Laryngoscope Size: Mac and 4 ?Grade View: Grade I ?Tube type: Oral ?Tube size: 7.5 mm ?Number of attempts: 1 ?Airway Equipment and Method: Stylet and Oral airway ?Placement Confirmation: ETT inserted through vocal cords under direct vision, positive ETCO2 and breath sounds checked- equal and bilateral ?Secured at: 22 cm ?Tube secured with: Tape ?Dental Injury: Teeth and Oropharynx as per pre-operative assessment  ? ? ? ? ?

## 2021-09-16 NOTE — H&P (Signed)
Robert Hart is an 71 y.o. male.   ?Chief Complaint: Chronic sinusitis ?HPI: 71 year old male with symptoms of chronic sinusitis involving the left side.  MR imaging demonstrated obstructive disease that has not responded well to medical therapy. ? ?Past Medical History:  ?Diagnosis Date  ? A-fib ([place])   ? Arthritis   ? Back pain   ? Dyspnea   ? Dysrhythmia   ? ED (erectile dysfunction)   ? GERD (gastroesophageal reflux disease)   ? Hearing loss   ? Hemorrhoids   ? Hyperlipidemia   ? Insomnia   ? Nocturia   ? PSA elevation   ? Rhinitis   ? Shoulder pain, bilateral   ? Urinary hesitancy   ? Vertigo   ? Vestibular neuropathy, right   ? ? ?Past Surgical History:  ?Procedure Laterality Date  ? CARDIOVERSION N/A 11/16/2014  ? Procedure: CARDIOVERSION;  Surgeon: Sanda Klein, MD;  Location: [facility] END[facility];  Service: Cardiovascular;  Laterality: N/A;  ? CATARACT EXTRACTION    ? COLONOSCOPY    ? DENTAL IMPLANT    ? RETINAL DETACHMENT SURGERY    ? TONSILLECTOMY AND ADENOIDECTOMY    ? VASECTOMY  05/23/1987  ? ? ?Family History  ?Problem Relation Age of Onset  ? Breast cancer Mother   ? Prostate cancer Father   ? COPD Father   ? Lung disease Father   ? ?Social History:  reports that he has never smoked. He has never used smokeless tobacco. He reports current alcohol use of about 1.0 - 2.0 standard drink per week. He reports that he does not use drugs. ? ?Allergies: No Known Allergies ? ?Medications Prior to Admission  ?Medication Sig Dispense Refill  ? ELIQUIS 5 MG TABS tablet TAKE ONE TABLET BY MOUTH TWICE A DAY. **MUST CALL MD FOR APPOINTMENT 60 tablet 2  ? fexofenadine (ALLEGRA) 180 MG tablet Take 180 mg by mouth daily.    ? flecainide (TAMBOCOR) 100 MG tablet TAKE ONE TABLET BY MOUTH TWICE A DAY 60 tablet 2  ? ketorolac (ACULAR) 0.5 % ophthalmic solution INSTILL 1 DROP INTO THE RIGHT EYE FOUR TIMES A DAY (Patient taking differently: Place 1 drop into the right eye in the morning and at bedtime.) 5 mL 1  ? metoprolol  succinate (TOPROL-XL) 25 MG 24 hr tablet TAKE 1/2 TABLET BY MOUTH DAILY 30 tablet 6  ? Multiple Vitamins-Minerals (MULTIVITAMIN & MINERAL PO) Take 1 tablet by mouth daily.    ? Multiple Vitamins-Minerals (PRESERVISION AREDS 2+MULTI VIT PO) Take 1 capsule by mouth in the morning and at bedtime.    ? pravastatin (PRAVACHOL) 40 MG tablet Take 1 tablet (40 mg total) by mouth daily.    ? traMADol (ULTRAM) 50 MG tablet Take 50 mg by mouth every 6 (six) hours as needed for moderate pain or severe pain.   4  ? traZODone (DESYREL) 100 MG tablet Take 100 mg by mouth at bedtime.   11  ? ? ?No results found for this or any previous visit (from the past 48 hour(s)). ?No results found. ? ?Review of Systems  ?All other systems reviewed and are negative. ? ?Blood pressure 135/79, pulse (!) 55, temperature 98.6 ?F (37 ?C), temperature source Oral, resp. rate 17, height '5\' 8"'$  (1.727 m), weight 68 kg, SpO2 96 %. ?Physical Exam ?Constitutional:   ?   Appearance: Normal appearance. He is normal weight.  ?HENT:  ?   Head: Normocephalic and atraumatic.  ?   Right Ear: External  ear normal.  ?   Left Ear: External ear normal.  ?   Nose: Nose normal.  ?   Mouth/Throat:  ?   Mouth: Mucous membranes are moist.  ?   Pharynx: Oropharynx is clear.  ?Eyes:  ?   Extraocular Movements: Extraocular movements intact.  ?   Conjunctiva/sclera: Conjunctivae normal.  ?   Pupils: Pupils are equal, round, and reactive to light.  ?Cardiovascular:  ?   Rate and Rhythm: Normal rate.  ?Pulmonary:  ?   Effort: Pulmonary effort is normal.  ?Musculoskeletal:  ?   Cervical back: Normal range of motion.  ?Skin: ?   General: Skin is warm and dry.  ?Neurological:  ?   General: No focal deficit present.  ?   Mental Status: He is alert and oriented to person, place, and time.  ?Psychiatric:     ?   Mood and Affect: Mood normal.     ?   Behavior: Behavior normal.     ?   Thought Content: Thought content normal.     ?   Judgment: Judgment normal.  ?   ? ?Assessment/Plan ?Chronic sinusitis ? ?To OR for left endoscopic sinus surgery. ? ?Melida Quitter, MD ?09/16/2021, 10:18 AM ? ? ? ?

## 2021-09-16 NOTE — Anesthesia Postprocedure Evaluation (Signed)
Anesthesia Post Note ? ?Patient: Robert Hart ? ?Procedure(s) Performed: LEFT SINUS ENDO WITH FUSION (Left: Nose) ?LEFT MAXILLARY ANTROSTOMY (Left: Nose) ?LEFT ANTERIOR ETHMOIDECTOMY (Left: Nose) ?LEFT FRONTAL SINUS EXPLORATION (Left: Nose) ? ?  ? ?Patient location during evaluation: PACU ?Anesthesia Type: General ?Level of consciousness: awake and alert and oriented ?Pain management: pain level controlled ?Vital Signs Assessment: post-procedure vital signs reviewed and stable ?Respiratory status: spontaneous breathing, nonlabored ventilation and respiratory function stable ?Cardiovascular status: blood pressure returned to baseline and stable ?Postop Assessment: no apparent nausea or vomiting ?Anesthetic complications: no ? ? ?No notable events documented. ? ?Last Vitals:  ?Vitals:  ? 09/16/21 1241 09/16/21 1255  ?BP: 121/70 132/77  ?Pulse: (!) 55 (!) 53  ?Resp: 14 11  ?Temp:  (!) 36.3 ?C  ?SpO2: 94% 96%  ?  ?Last Pain:  ?Vitals:  ? 09/16/21 1226  ?TempSrc:   ?PainSc: Asleep  ? ? ?  ?  ?  ?  ?  ?  ? ?Haydan Mansouri A. ? ? ? ? ?

## 2021-09-16 NOTE — Transfer of Care (Signed)
Immediate Anesthesia Transfer of Care Note ? ?Patient: Robert Hart ? ?Procedure(s) Performed: LEFT SINUS ENDO WITH FUSION (Left: Nose) ?LEFT MAXILLARY ANTROSTOMY (Left: Nose) ?LEFT ANTERIOR ETHMOIDECTOMY (Left: Nose) ?LEFT FRONTAL SINUS EXPLORATION (Left: Nose) ? ?Patient Location: PACU ? ?Anesthesia Type:General ? ?Level of Consciousness: drowsy ? ?Airway & Oxygen Therapy: Patient Spontanous Breathing ? ?Post-op Assessment: Report given to RN and Post -op Vital signs reviewed and stable ? ?Post vital signs: Reviewed and stable ? ?Last Vitals:  ?Vitals Value Taken Time  ?BP 127/78 09/16/21 1226  ?Temp    ?Pulse 53 09/16/21 1227  ?Resp 11 09/16/21 1227  ?SpO2 95 % 09/16/21 1227  ?Vitals shown include unvalidated device data. ? ?Last Pain:  ?Vitals:  ? 09/16/21 0829  ?TempSrc:   ?PainSc: 0-No pain  ?   ? ?  ? ?Complications: No notable events documented. ?

## 2021-09-16 NOTE — Brief Op Note (Signed)
09/16/2021 ? ?12:10 PM ? ?PATIENT:  Robert Hart  71 y.o. male ? ?PRE-OPERATIVE DIAGNOSIS:  Chronic sinusitis ? ?POST-OPERATIVE DIAGNOSIS:  Chronic sinusitis ? ?PROCEDURE:  Procedure(s): ?SINUS ENDO WITH FUSION (Left) ?Left maxillary antrostomy, left anterior ethmoidectomy, left frontal recess exploration, Fusion image guidance ? ?SURGEON:  Surgeon(s) and Role: ?   Melida Quitter, MD - Primary ? ?PHYSICIAN ASSISTANT:  ? ?ASSISTANTS: none  ? ?ANESTHESIA:   general ? ?EBL:  Minimal  ? ?BLOOD ADMINISTERED:none ? ?DRAINS: none  ? ?LOCAL MEDICATIONS USED:  LIDOCAINE  ? ?SPECIMEN:  Source of Specimen:  Left sinus contents ? ?DISPOSITION OF SPECIMEN:  PATHOLOGY ? ?COUNTS:  YES ? ?TOURNIQUET:  * No tourniquets in log * ? ?DICTATION: .Note written in EPIC ? ?PLAN OF CARE: Discharge to home after PACU ? ?PATIENT DISPOSITION:  PACU - hemodynamically stable. ?  ?Delay start of Pharmacological VTE agent (>24hrs) due to surgical blood loss or risk of bleeding: no ? ?

## 2021-09-17 ENCOUNTER — Encounter (HOSPITAL_COMMUNITY): Payer: Self-pay | Admitting: Otolaryngology

## 2021-09-19 LAB — SURGICAL PATHOLOGY

## 2021-09-20 LAB — AEROBIC/ANAEROBIC CULTURE W GRAM STAIN (SURGICAL/DEEP WOUND)

## 2021-09-29 DIAGNOSIS — Z9889 Other specified postprocedural states: Secondary | ICD-10-CM | POA: Diagnosis not present

## 2021-09-29 DIAGNOSIS — Z8709 Personal history of other diseases of the respiratory system: Secondary | ICD-10-CM | POA: Diagnosis not present

## 2021-09-29 DIAGNOSIS — J343 Hypertrophy of nasal turbinates: Secondary | ICD-10-CM | POA: Diagnosis not present

## 2021-10-12 ENCOUNTER — Other Ambulatory Visit (INDEPENDENT_AMBULATORY_CARE_PROVIDER_SITE_OTHER): Payer: Self-pay | Admitting: Ophthalmology

## 2021-10-25 DIAGNOSIS — J322 Chronic ethmoidal sinusitis: Secondary | ICD-10-CM | POA: Diagnosis not present

## 2021-10-25 DIAGNOSIS — Z87891 Personal history of nicotine dependence: Secondary | ICD-10-CM | POA: Diagnosis not present

## 2021-10-26 ENCOUNTER — Encounter (INDEPENDENT_AMBULATORY_CARE_PROVIDER_SITE_OTHER): Payer: Self-pay | Admitting: Ophthalmology

## 2021-10-26 ENCOUNTER — Ambulatory Visit (INDEPENDENT_AMBULATORY_CARE_PROVIDER_SITE_OTHER): Payer: Medicare Other | Admitting: Ophthalmology

## 2021-10-26 DIAGNOSIS — H35372 Puckering of macula, left eye: Secondary | ICD-10-CM | POA: Diagnosis not present

## 2021-10-26 DIAGNOSIS — Z23 Encounter for immunization: Secondary | ICD-10-CM | POA: Diagnosis not present

## 2021-10-26 DIAGNOSIS — H353132 Nonexudative age-related macular degeneration, bilateral, intermediate dry stage: Secondary | ICD-10-CM

## 2021-10-26 NOTE — Assessment & Plan Note (Signed)
The nature of age--related macular degeneration was discussed with the patient as well as the distinction between dry and wet types. Checking an [place] Grid daily with advice to return immediately should a distortion develop, was given to the patient. The patient 's smoking status now and in the past was determined and advice based on the AREDS study was provided regarding the consumption of antioxidant supplements. AREDS 2 vitamin formulation was recommended. Consumption of dark leafy vegetables and fresh fruits of various colors was recommended. Treatment modalities for wet macular degeneration particularly the use of intravitreal injections of anti-blood vessel growth factors was discussed with the patient. Avastin, Lucentis, and Eylea are the available options. On occasion, therapy includes the use of photodynamic therapy and thermal laser. Stressed to the patient do not rub eyes.  Patient was advised to check Amsler Grid daily and return immediately if changes are noted. Instructions on using the grid were given to the patient. All patient questions were answered.  The nature of dry age related macular degeneration was discussed with the patient as well as its possible conversion to wet. The results of the AREDS 2 study was discussed with the patient. A diet rich in dark leafy green vegetables was advised and specific recommendations were made regarding supplements with AREDS 2 formulation . Control of hypertension and serum cholesterol may slow the disease. Smoking cessation is mandatory to slow the disease and diminish the risk of progressing to wet age related macular degeneration. The patient was instructed in the use of an [place] and was told to return immediately for any changes in the Grid. Stressed to the patient do not rub eyes  Discussed the upcoming availability of Syfovre for geographic atrophy, dry AMD.  Which the patient does not qualify for.

## 2021-10-26 NOTE — Assessment & Plan Note (Signed)
OS with epiretinal membrane and thickening. No signs of outer photoreceptor disruption at this stage.

## 2021-10-26 NOTE — Progress Notes (Signed)
10/26/2021     CHIEF COMPLAINT Patient presents for  Chief Complaint  Patient presents with   Macular Degeneration      HISTORY OF PRESENT ILLNESS: Robert Hart is a 71 y.o. male who presents to the clinic today for:   HPI   5 MOS FU OU OCT. Pt stated no changes in vision Pt denies floaters and FOL. Pt had sinus surgery in April 2023. Pt will have a cochlear surgery on Tuesday. Pt is still taking KETOROLAC- 2x daily in OD   Last edited by Silvestre Moment on 10/26/2021  8:39 AM.      Referring physician: Donnajean Lopes, MD [place],  [place] [address]  HISTORICAL INFORMATION:   Selected notes from the MEDICAL RECORD NUMBER       CURRENT MEDICATIONS: Current Outpatient Medications (Ophthalmic Drugs)  Medication Sig   ketorolac (ACULAR) 0.5 % ophthalmic solution Place 1 drop into the right eye in the morning and at bedtime.   No current facility-administered medications for this visit. (Ophthalmic Drugs)   Current Outpatient Medications (Other)  Medication Sig   ELIQUIS 5 MG TABS tablet TAKE ONE TABLET BY MOUTH TWICE A DAY. **MUST CALL MD FOR APPOINTMENT   fexofenadine (ALLEGRA) 180 MG tablet Take 180 mg by mouth daily.   flecainide (TAMBOCOR) 100 MG tablet TAKE ONE TABLET BY MOUTH TWICE A DAY   HYDROcodone-acetaminophen (NORCO) 7.5-325 MG tablet Take 1 tablet by mouth every 6 (six) hours as needed for moderate pain.   metoprolol succinate (TOPROL-XL) 25 MG 24 hr tablet TAKE 1/2 TABLET BY MOUTH DAILY   Multiple Vitamins-Minerals (MULTIVITAMIN & MINERAL PO) Take 1 tablet by mouth daily.   Multiple Vitamins-Minerals (PRESERVISION AREDS 2+MULTI VIT PO) Take 1 capsule by mouth in the morning and at bedtime.   pravastatin (PRAVACHOL) 40 MG tablet Take 1 tablet (40 mg total) by mouth daily.   traMADol (ULTRAM) 50 MG tablet Take 50 mg by mouth every 6 (six) hours as needed for moderate pain or severe pain.    traZODone (DESYREL) 100 MG tablet Take 100 mg by mouth  at bedtime.    No current facility-administered medications for this visit. (Other)      REVIEW OF SYSTEMS: ROS   Negative for: Constitutional, Gastrointestinal, Neurological, Skin, Genitourinary, Musculoskeletal, HENT, Endocrine, Cardiovascular, Eyes, Respiratory, Psychiatric, Allergic/Imm, Heme/Lymph Last edited by Silvestre Moment on 10/26/2021  8:39 AM.       ALLERGIES No Known Allergies  PAST MEDICAL HISTORY Past Medical History:  Diagnosis Date   A-fib ([place])    Arthritis    Back pain    Dyspnea    Dysrhythmia    ED (erectile dysfunction)    GERD (gastroesophageal reflux disease)    Hearing loss    Hemorrhoids    Hyperlipidemia    Insomnia    Nocturia    PSA elevation    Rhinitis    Shoulder pain, bilateral    Urinary hesitancy    Vertigo    Vestibular neuropathy, right    Past Surgical History:  Procedure Laterality Date   CARDIOVERSION N/A 11/16/2014   Procedure: CARDIOVERSION;  Surgeon: Sanda Klein, MD;  Location: [facility];  Service: Cardiovascular;  Laterality: N/A;   CATARACT EXTRACTION     COLONOSCOPY     DENTAL IMPLANT     ETHMOIDECTOMY Left 09/16/2021   Procedure: LEFT ANTERIOR ETHMOIDECTOMY;  Surgeon: Melida Quitter, MD;  Location: [facility];  Service: ENT;  Laterality: Left;   FRONTAL SINUS  EXPLORATION Left 09/16/2021   Procedure: LEFT FRONTAL SINUS EXPLORATION;  Surgeon: Melida Quitter, MD;  Location: [facility];  Service: ENT;  Laterality: Left;   MAXILLARY ANTROSTOMY Left 09/16/2021   Procedure: LEFT MAXILLARY ANTROSTOMY;  Surgeon: Melida Quitter, MD;  Location: [facility];  Service: ENT;  Laterality: Left;   RETINAL DETACHMENT SURGERY     SINUS ENDO WITH FUSION Left 09/16/2021   Procedure: LEFT SINUS ENDO WITH FUSION;  Surgeon: Melida Quitter, MD;  Location: [facility];  Service: ENT;  Laterality: Left;   TONSILLECTOMY AND ADENOIDECTOMY     VASECTOMY  05/23/1987    FAMILY HISTORY Family History  Problem Relation Age of Onset   Breast cancer Mother    Prostate  cancer Father    COPD Father    Lung disease Father     SOCIAL HISTORY Social History   Tobacco Use   Smoking status: Never   Smokeless tobacco: Never  Vaping Use   Vaping Use: Never used  Substance Use Topics   Alcohol use: Yes    Alcohol/week: 1.0 - 2.0 standard drink    Types: 1 - 2 Standard drinks or equivalent per week   Drug use: Never         OPHTHALMIC EXAM:  Base Eye Exam     Visual Acuity (ETDRS)       Right Left   Dist cc 20/30 +1 20/25 -1 +2    Correction: Glasses         Tonometry (Tonopen, 8:44 AM)       Right Left   Pressure 11 13         Pupils       Pupils APD   Right PERRL None   Left PERRL None         Visual Fields       Left Right    Full Full         Extraocular Movement       Right Left    Full Full         Neuro/Psych     Oriented x3: Yes   Mood/Affect: Normal         Dilation     Both eyes: 1.0% Mydriacyl, 2.5% Phenylephrine @ 8:44 AM           Slit Lamp and Fundus Exam     External Exam       Right Left   External Normal Normal         Slit Lamp Exam       Right Left   Lids/Lashes Normal Normal   Conjunctiva/Sclera White and quiet White and quiet   Cornea Clear Clear   Anterior Chamber Deep and quiet Deep and quiet   Iris Round and reactive Round and reactive   Lens Posterior chamber intraocular lens Posterior chamber intraocular lens   Anterior Vitreous Normal Normal         Fundus Exam       Right Left   Posterior Vitreous Normal, vitrectomized Normal, vitrectomized   Disc Normal Normal   C/D Ratio 0.05 0.0   Macula Intermediate age related macular degeneration, Soft drusen subfoveal some confluence, no hemorrhage, no macular thickening Intermediate age related macular degeneration, Soft drusen subfoveal some confluent, no hemorrhage, no macular thickening, mild topographic distortion nasally   Vessels Normal Normal   Periphery Normal,, no retinal breaks.  Good scleral  buckle Normal,, no new breaks  IMAGING AND PROCEDURES  Imaging and Procedures for 10/26/21  OCT, Retina - OU - Both Eyes       Right Eye Quality was good. Scan locations included subfoveal. Central Foveal Thickness: 429. Progression has been stable. Findings include abnormal foveal contour, retinal drusen , no IRF, no SRF.   Left Eye Scan locations included subfoveal. Central Foveal Thickness: 402. Progression has worsened. Findings include retinal drusen , no SRF, no IRF, epiretinal membrane.   Notes No signs of complications of ARMD OU, observe  OS, minor epiretinal membrane nasal to the fovea observe with slight increase in thickening overall with continued increased deposition of large soft drusen subfoveal location as compared to 7 years previous             ASSESSMENT/PLAN:  Macular pucker, left eye OS with epiretinal membrane and thickening. No signs of outer photoreceptor disruption at this stage.  Intermediate stage nonexudative age-related macular degeneration of both eyes The nature of age--related macular degeneration was discussed with the patient as well as the distinction between dry and wet types. Checking an Amsler Grid daily with advice to return immediately should a distortion develop, was given to the patient. The patient 's smoking status now and in the past was determined and advice based on the AREDS study was provided regarding the consumption of antioxidant supplements. AREDS 2 vitamin formulation was recommended. Consumption of dark leafy vegetables and fresh fruits of various colors was recommended. Treatment modalities for wet macular degeneration particularly the use of intravitreal injections of anti-blood vessel growth factors was discussed with the patient. Avastin, Lucentis, and Eylea are the available options. On occasion, therapy includes the use of photodynamic therapy and thermal laser. Stressed to the patient do not rub eyes.   Patient was advised to check Amsler Grid daily and return immediately if changes are noted. Instructions on using the grid were given to the patient. All patient questions were answered.  The nature of dry age related macular degeneration was discussed with the patient as well as its possible conversion to wet. The results of the AREDS 2 study was discussed with the patient. A diet rich in dark leafy green vegetables was advised and specific recommendations were made regarding supplements with AREDS 2 formulation . Control of hypertension and serum cholesterol may slow the disease. Smoking cessation is mandatory to slow the disease and diminish the risk of progressing to wet age related macular degeneration. The patient was instructed in the use of an Ackerman and was told to return immediately for any changes in the Grid. Stressed to the patient do not rub eyes  Discussed the upcoming availability of Syfovre for geographic atrophy, dry AMD.  Which the patient does not qualify for.     ICD-10-CM   1. Intermediate stage nonexudative age-related macular degeneration of both eyes  H35.3132 OCT, Retina - OU - Both Eyes    2. Macular pucker, left eye  H35.372       1.  OS with slight increase in thickening in the macular from epiretinal membrane we will continue to monitor.  Still with no impact on visual acuity will continue to observe.  But monitor closely  2.  Intermediate ARMD OU patient continues on AREDS 2 supplements.  3.  Neither eye shows evidence of geographic atrophy for which the new medication Syfovre is will be available soon, I have explained to the patient he should not worry that he does not need that medication  Ophthalmic Meds Ordered  this visit:  No orders of the defined types were placed in this encounter.      Return in about 6 months (around 04/27/2022) for DILATE OU, OCT.  There are no Patient Instructions on file for this visit.   Explained the diagnoses, plan, and  follow up with the patient and they expressed understanding.  Patient expressed understanding of the importance of proper follow up care.   Clent Demark Edwin Baines M.D. Diseases & Surgery of the Retina and Vitreous [facility] [place] 10/26/21     Abbreviations: M myopia (nearsighted); A astigmatism; H hyperopia (farsighted); P presbyopia; Mrx spectacle prescription;  CTL contact lenses; OD right eye; OS left eye; OU both eyes  XT exotropia; ET esotropia; PEK punctate epithelial keratitis; PEE punctate epithelial erosions; DES dry eye syndrome; MGD meibomian gland dysfunction; ATs artificial tears; PFAT's preservative free artificial tears; [place] nuclear sclerotic cataract; PSC posterior subcapsular cataract; ERM epi-retinal membrane; PVD posterior vitreous detachment; RD retinal detachment; DM diabetes mellitus; DR diabetic retinopathy; NPDR non-proliferative diabetic retinopathy; PDR proliferative diabetic retinopathy; CSME clinically significant macular edema; DME diabetic macular edema; dbh dot blot hemorrhages; CWS cotton wool spot; POAG primary open angle glaucoma; C/D cup-to-disc ratio; HVF humphrey visual field; GVF goldmann visual field; OCT optical coherence tomography; IOP intraocular pressure; BRVO Branch retinal vein occlusion; CRVO central retinal vein occlusion; CRAO central retinal artery occlusion; BRAO branch retinal artery occlusion; RT retinal tear; SB scleral buckle; PPV pars plana vitrectomy; VH Vitreous hemorrhage; PRP panretinal laser photocoagulation; IVK intravitreal kenalog; VMT vitreomacular traction; MH Macular hole;  NVD neovascularization of the disc; NVE neovascularization elsewhere; AREDS age related eye disease study; ARMD age related macular degeneration; POAG primary open angle glaucoma; EBMD epithelial/anterior basement membrane dystrophy; ACIOL anterior chamber intraocular lens; IOL intraocular lens; PCIOL posterior chamber intraocular lens; Phaco/IOL  phacoemulsification with intraocular lens placement; [place] photorefractive keratectomy; LASIK laser assisted in situ keratomileusis; HTN hypertension; DM diabetes mellitus; COPD chronic obstructive pulmonary disease

## 2021-11-01 ENCOUNTER — Encounter (INDEPENDENT_AMBULATORY_CARE_PROVIDER_SITE_OTHER): Payer: Medicare Other | Admitting: Ophthalmology

## 2021-11-01 DIAGNOSIS — Z79899 Other long term (current) drug therapy: Secondary | ICD-10-CM | POA: Diagnosis not present

## 2021-11-01 DIAGNOSIS — H903 Sensorineural hearing loss, bilateral: Secondary | ICD-10-CM | POA: Diagnosis not present

## 2021-11-01 DIAGNOSIS — H9311 Tinnitus, right ear: Secondary | ICD-10-CM | POA: Diagnosis not present

## 2021-11-01 DIAGNOSIS — I10 Essential (primary) hypertension: Secondary | ICD-10-CM | POA: Diagnosis not present

## 2021-11-01 DIAGNOSIS — E785 Hyperlipidemia, unspecified: Secondary | ICD-10-CM | POA: Diagnosis not present

## 2021-11-01 DIAGNOSIS — R42 Dizziness and giddiness: Secondary | ICD-10-CM | POA: Diagnosis not present

## 2021-11-01 DIAGNOSIS — Z87891 Personal history of nicotine dependence: Secondary | ICD-10-CM | POA: Diagnosis not present

## 2021-11-01 DIAGNOSIS — Z45321 Encounter for adjustment and management of cochlear device: Secondary | ICD-10-CM | POA: Diagnosis not present

## 2021-11-01 DIAGNOSIS — Z7901 Long term (current) use of anticoagulants: Secondary | ICD-10-CM | POA: Diagnosis not present

## 2021-11-01 DIAGNOSIS — H9121 Sudden idiopathic hearing loss, right ear: Secondary | ICD-10-CM | POA: Diagnosis not present

## 2021-11-01 DIAGNOSIS — I4819 Other persistent atrial fibrillation: Secondary | ICD-10-CM | POA: Diagnosis not present

## 2021-11-02 DIAGNOSIS — E785 Hyperlipidemia, unspecified: Secondary | ICD-10-CM | POA: Diagnosis not present

## 2021-11-02 DIAGNOSIS — R42 Dizziness and giddiness: Secondary | ICD-10-CM | POA: Diagnosis not present

## 2021-11-02 DIAGNOSIS — H9121 Sudden idiopathic hearing loss, right ear: Secondary | ICD-10-CM | POA: Diagnosis not present

## 2021-11-02 DIAGNOSIS — H9311 Tinnitus, right ear: Secondary | ICD-10-CM | POA: Diagnosis not present

## 2021-11-02 DIAGNOSIS — I10 Essential (primary) hypertension: Secondary | ICD-10-CM | POA: Diagnosis not present

## 2021-11-02 DIAGNOSIS — I4819 Other persistent atrial fibrillation: Secondary | ICD-10-CM | POA: Diagnosis not present

## 2021-11-10 DIAGNOSIS — R350 Frequency of micturition: Secondary | ICD-10-CM | POA: Diagnosis not present

## 2021-11-10 DIAGNOSIS — Z9621 Cochlear implant status: Secondary | ICD-10-CM | POA: Diagnosis not present

## 2021-11-10 DIAGNOSIS — Z79899 Other long term (current) drug therapy: Secondary | ICD-10-CM | POA: Diagnosis not present

## 2021-11-10 DIAGNOSIS — J320 Chronic maxillary sinusitis: Secondary | ICD-10-CM | POA: Diagnosis not present

## 2021-11-10 DIAGNOSIS — J339 Nasal polyp, unspecified: Secondary | ICD-10-CM | POA: Diagnosis not present

## 2021-11-10 DIAGNOSIS — Z87891 Personal history of nicotine dependence: Secondary | ICD-10-CM | POA: Diagnosis not present

## 2021-11-10 DIAGNOSIS — G479 Sleep disorder, unspecified: Secondary | ICD-10-CM | POA: Diagnosis not present

## 2021-11-10 DIAGNOSIS — H9121 Sudden idiopathic hearing loss, right ear: Secondary | ICD-10-CM | POA: Diagnosis not present

## 2021-11-10 DIAGNOSIS — J322 Chronic ethmoidal sinusitis: Secondary | ICD-10-CM | POA: Diagnosis not present

## 2021-11-10 DIAGNOSIS — Z4889 Encounter for other specified surgical aftercare: Secondary | ICD-10-CM | POA: Diagnosis not present

## 2021-11-10 DIAGNOSIS — Z4802 Encounter for removal of sutures: Secondary | ICD-10-CM | POA: Diagnosis not present

## 2021-11-10 DIAGNOSIS — H9311 Tinnitus, right ear: Secondary | ICD-10-CM | POA: Diagnosis not present

## 2021-11-18 ENCOUNTER — Other Ambulatory Visit (HOSPITAL_COMMUNITY): Payer: Self-pay | Admitting: Nurse Practitioner

## 2021-12-09 DIAGNOSIS — Z9621 Cochlear implant status: Secondary | ICD-10-CM | POA: Diagnosis not present

## 2021-12-09 DIAGNOSIS — H9311 Tinnitus, right ear: Secondary | ICD-10-CM | POA: Diagnosis not present

## 2021-12-09 DIAGNOSIS — Z48810 Encounter for surgical aftercare following surgery on the sense organs: Secondary | ICD-10-CM | POA: Diagnosis not present

## 2021-12-09 DIAGNOSIS — H832X1 Labyrinthine dysfunction, right ear: Secondary | ICD-10-CM | POA: Diagnosis not present

## 2021-12-09 DIAGNOSIS — H903 Sensorineural hearing loss, bilateral: Secondary | ICD-10-CM | POA: Diagnosis not present

## 2021-12-09 DIAGNOSIS — Z87891 Personal history of nicotine dependence: Secondary | ICD-10-CM | POA: Diagnosis not present

## 2021-12-09 DIAGNOSIS — J322 Chronic ethmoidal sinusitis: Secondary | ICD-10-CM | POA: Diagnosis not present

## 2021-12-12 DIAGNOSIS — Z9621 Cochlear implant status: Secondary | ICD-10-CM | POA: Diagnosis not present

## 2021-12-12 DIAGNOSIS — Z45321 Encounter for adjustment and management of cochlear device: Secondary | ICD-10-CM | POA: Diagnosis not present

## 2021-12-26 DIAGNOSIS — Z45321 Encounter for adjustment and management of cochlear device: Secondary | ICD-10-CM | POA: Diagnosis not present

## 2022-01-16 DIAGNOSIS — Z9621 Cochlear implant status: Secondary | ICD-10-CM | POA: Diagnosis not present

## 2022-01-16 DIAGNOSIS — Z45321 Encounter for adjustment and management of cochlear device: Secondary | ICD-10-CM | POA: Diagnosis not present

## 2022-01-21 ENCOUNTER — Other Ambulatory Visit (HOSPITAL_COMMUNITY): Payer: Self-pay | Admitting: Nurse Practitioner

## 2022-04-27 ENCOUNTER — Encounter (INDEPENDENT_AMBULATORY_CARE_PROVIDER_SITE_OTHER): Payer: Medicare Other | Admitting: Ophthalmology

## 2022-04-27 DIAGNOSIS — H35351 Cystoid macular degeneration, right eye: Secondary | ICD-10-CM | POA: Diagnosis not present

## 2022-04-27 DIAGNOSIS — H35372 Puckering of macula, left eye: Secondary | ICD-10-CM | POA: Diagnosis not present

## 2022-04-27 DIAGNOSIS — H353132 Nonexudative age-related macular degeneration, bilateral, intermediate dry stage: Secondary | ICD-10-CM | POA: Diagnosis not present

## 2022-05-21 ENCOUNTER — Other Ambulatory Visit (HOSPITAL_COMMUNITY): Payer: Self-pay | Admitting: Nurse Practitioner

## 2022-05-29 DIAGNOSIS — H903 Sensorineural hearing loss, bilateral: Secondary | ICD-10-CM | POA: Diagnosis not present

## 2022-05-29 DIAGNOSIS — Z45321 Encounter for adjustment and management of cochlear device: Secondary | ICD-10-CM | POA: Diagnosis not present

## 2022-06-01 DIAGNOSIS — H832X1 Labyrinthine dysfunction, right ear: Secondary | ICD-10-CM | POA: Diagnosis not present

## 2022-06-01 DIAGNOSIS — Z48810 Encounter for surgical aftercare following surgery on the sense organs: Secondary | ICD-10-CM | POA: Diagnosis not present

## 2022-06-01 DIAGNOSIS — Z9621 Cochlear implant status: Secondary | ICD-10-CM | POA: Diagnosis not present

## 2022-06-01 DIAGNOSIS — H9311 Tinnitus, right ear: Secondary | ICD-10-CM | POA: Diagnosis not present

## 2022-06-01 DIAGNOSIS — H8191 Unspecified disorder of vestibular function, right ear: Secondary | ICD-10-CM | POA: Diagnosis not present

## 2022-06-01 DIAGNOSIS — H903 Sensorineural hearing loss, bilateral: Secondary | ICD-10-CM | POA: Diagnosis not present

## 2022-06-01 DIAGNOSIS — Z4889 Encounter for other specified surgical aftercare: Secondary | ICD-10-CM | POA: Diagnosis not present

## 2022-06-12 DIAGNOSIS — D1801 Hemangioma of skin and subcutaneous tissue: Secondary | ICD-10-CM | POA: Diagnosis not present

## 2022-06-12 DIAGNOSIS — L858 Other specified epidermal thickening: Secondary | ICD-10-CM | POA: Diagnosis not present

## 2022-06-12 DIAGNOSIS — L821 Other seborrheic keratosis: Secondary | ICD-10-CM | POA: Diagnosis not present

## 2022-06-12 DIAGNOSIS — L578 Other skin changes due to chronic exposure to nonionizing radiation: Secondary | ICD-10-CM | POA: Diagnosis not present

## 2022-06-12 DIAGNOSIS — Z85828 Personal history of other malignant neoplasm of skin: Secondary | ICD-10-CM | POA: Diagnosis not present

## 2022-06-12 DIAGNOSIS — L111 Transient acantholytic dermatosis [Grover]: Secondary | ICD-10-CM | POA: Diagnosis not present

## 2022-06-20 ENCOUNTER — Other Ambulatory Visit (HOSPITAL_COMMUNITY): Payer: Self-pay | Admitting: Nurse Practitioner

## 2022-06-20 NOTE — Telephone Encounter (Signed)
This is a A-Fib clinic pt. Pt is still supposed to come to the A-Fuib clinic. Please address

## 2022-06-29 ENCOUNTER — Encounter (HOSPITAL_COMMUNITY): Payer: Self-pay | Admitting: *Deleted

## 2022-07-22 ENCOUNTER — Other Ambulatory Visit (HOSPITAL_COMMUNITY): Payer: Self-pay | Admitting: Nurse Practitioner

## 2022-08-02 ENCOUNTER — Other Ambulatory Visit (HOSPITAL_COMMUNITY): Payer: Self-pay | Admitting: Nurse Practitioner

## 2022-08-24 ENCOUNTER — Other Ambulatory Visit (HOSPITAL_COMMUNITY): Payer: Self-pay | Admitting: *Deleted

## 2022-08-24 DIAGNOSIS — Z125 Encounter for screening for malignant neoplasm of prostate: Secondary | ICD-10-CM | POA: Diagnosis not present

## 2022-08-24 DIAGNOSIS — E785 Hyperlipidemia, unspecified: Secondary | ICD-10-CM | POA: Diagnosis not present

## 2022-08-24 DIAGNOSIS — R7989 Other specified abnormal findings of blood chemistry: Secondary | ICD-10-CM | POA: Diagnosis not present

## 2022-08-24 DIAGNOSIS — K219 Gastro-esophageal reflux disease without esophagitis: Secondary | ICD-10-CM | POA: Diagnosis not present

## 2022-08-24 DIAGNOSIS — N529 Male erectile dysfunction, unspecified: Secondary | ICD-10-CM | POA: Diagnosis not present

## 2022-08-24 MED ORDER — FLECAINIDE ACETATE 100 MG PO TABS: 100.0000 mg | ORAL_TABLET | Freq: Two times a day (BID) | ORAL | 0 refills | Status: DC

## 2022-08-31 ENCOUNTER — Other Ambulatory Visit (HOSPITAL_COMMUNITY): Payer: Self-pay

## 2022-08-31 DIAGNOSIS — Z7901 Long term (current) use of anticoagulants: Secondary | ICD-10-CM | POA: Diagnosis not present

## 2022-08-31 DIAGNOSIS — I480 Paroxysmal atrial fibrillation: Secondary | ICD-10-CM | POA: Diagnosis not present

## 2022-08-31 DIAGNOSIS — R82998 Other abnormal findings in urine: Secondary | ICD-10-CM | POA: Diagnosis not present

## 2022-08-31 DIAGNOSIS — Z0000 Encounter for general adult medical examination without abnormal findings: Secondary | ICD-10-CM | POA: Diagnosis not present

## 2022-08-31 DIAGNOSIS — H8101 Meniere's disease, right ear: Secondary | ICD-10-CM | POA: Diagnosis not present

## 2022-08-31 DIAGNOSIS — R972 Elevated prostate specific antigen [PSA]: Secondary | ICD-10-CM | POA: Diagnosis not present

## 2022-08-31 DIAGNOSIS — Z1331 Encounter for screening for depression: Secondary | ICD-10-CM | POA: Diagnosis not present

## 2022-08-31 DIAGNOSIS — G4700 Insomnia, unspecified: Secondary | ICD-10-CM | POA: Diagnosis not present

## 2022-08-31 DIAGNOSIS — E785 Hyperlipidemia, unspecified: Secondary | ICD-10-CM | POA: Diagnosis not present

## 2022-08-31 DIAGNOSIS — J309 Allergic rhinitis, unspecified: Secondary | ICD-10-CM | POA: Diagnosis not present

## 2022-08-31 DIAGNOSIS — Z1339 Encounter for screening examination for other mental health and behavioral disorders: Secondary | ICD-10-CM | POA: Diagnosis not present

## 2022-08-31 MED ORDER — APIXABAN 5 MG PO TABS: 5.0000 mg | ORAL_TABLET | Freq: Two times a day (BID) | ORAL | 0 refills | Status: DC

## 2022-09-01 ENCOUNTER — Encounter (HOSPITAL_COMMUNITY): Payer: Self-pay | Admitting: Physician Assistant

## 2022-09-01 ENCOUNTER — Ambulatory Visit (HOSPITAL_COMMUNITY)
Admission: RE | Admit: 2022-09-01 | Discharge: 2022-09-01 | Disposition: A | Discharge status: Home / Self Care | Payer: Medicare Other | Source: Ambulatory Visit | Attending: Physician Assistant | Admitting: Physician Assistant

## 2022-09-01 ENCOUNTER — Other Ambulatory Visit (HOSPITAL_COMMUNITY): Payer: Self-pay | Admitting: *Deleted

## 2022-09-01 VITALS — BP 116/72 | HR 54 | Ht 68.0 in | Wt 155.0 lb

## 2022-09-01 DIAGNOSIS — Z8673 Personal history of transient ischemic attack (TIA), and cerebral infarction without residual deficits: Secondary | ICD-10-CM | POA: Diagnosis not present

## 2022-09-01 DIAGNOSIS — Z7901 Long term (current) use of anticoagulants: Secondary | ICD-10-CM | POA: Insufficient documentation

## 2022-09-01 DIAGNOSIS — D6869 Other thrombophilia: Secondary | ICD-10-CM | POA: Diagnosis not present

## 2022-09-01 DIAGNOSIS — Z5181 Encounter for therapeutic drug level monitoring: Secondary | ICD-10-CM | POA: Diagnosis not present

## 2022-09-01 DIAGNOSIS — I480 Paroxysmal atrial fibrillation: Secondary | ICD-10-CM

## 2022-09-01 DIAGNOSIS — I10 Essential (primary) hypertension: Secondary | ICD-10-CM | POA: Diagnosis not present

## 2022-09-01 DIAGNOSIS — Z79899 Other long term (current) drug therapy: Secondary | ICD-10-CM

## 2022-09-01 DIAGNOSIS — I4819 Other persistent atrial fibrillation: Secondary | ICD-10-CM

## 2022-09-01 DIAGNOSIS — E785 Hyperlipidemia, unspecified: Secondary | ICD-10-CM | POA: Diagnosis not present

## 2022-09-01 DIAGNOSIS — R0683 Snoring: Secondary | ICD-10-CM | POA: Diagnosis not present

## 2022-09-01 MED ORDER — METOPROLOL SUCCINATE ER 25 MG PO TB24: ORAL_TABLET | ORAL | 2 refills | Status: DC

## 2022-09-01 MED ORDER — FLECAINIDE ACETATE 100 MG PO TABS: 100.0000 mg | ORAL_TABLET | Freq: Two times a day (BID) | ORAL | 2 refills | Status: DC

## 2022-09-01 MED ORDER — APIXABAN 5 MG PO TABS: 5.0000 mg | ORAL_TABLET | Freq: Two times a day (BID) | ORAL | 2 refills | Status: DC

## 2022-09-01 NOTE — Progress Notes (Signed)
Primary Care Physician: Garlan Fillers, MD Referring Physician: Dr. Gevena Barre is a 72 y.o. male with a h/o HLD, CVA, and atrial fibrillation who presents for follow up in the [facility]. Patient underwent DCCV in 2016 which was unsuccessful. He was started on flecainide and converted chemically. He has been maintained on flecainide. Patient is on Eliquis for a CHADS2VASC score of 2.  On follow up today, he is doing well with no recent episodes of Afib. He is in SR today. He is compliant with flecainide, Toprol, and Eliquis. No bleeding concerns. He had a sleep study years ago but would like to redo since according to wife he snores. Also, sometimes he feels tired after waking up.  Today, he denies symptoms of palpitations, chest pain, shortness of breath, orthopnea, PND, lower extremity edema, dizziness, presyncope, syncope, or neurologic sequela. The patient is tolerating medications without difficulties and is otherwise without complaint today.   Past Medical History:  Diagnosis Date   A-fib    Arthritis    Back pain    Dyspnea    Dysrhythmia    ED (erectile dysfunction)    GERD (gastroesophageal reflux disease)    Hearing loss    Hemorrhoids    Hyperlipidemia    Insomnia    Nocturia    PSA elevation    Rhinitis    Shoulder pain, bilateral    Urinary hesitancy    Vertigo    Vestibular neuropathy, right    Past Surgical History:  Procedure Laterality Date   CARDIOVERSION N/A 11/16/2014   Procedure: CARDIOVERSION;  Surgeon: Thurmon Fair, MD;  [facility];  Service: Cardiovascular;  Laterality: N/A;   CATARACT EXTRACTION     COLONOSCOPY     DENTAL IMPLANT     ETHMOIDECTOMY Left 09/16/2021   Procedure: LEFT ANTERIOR ETHMOIDECTOMY;  Surgeon: Christia Reading, MD;  Location: [facility] OR;  Service: ENT;  Laterality: Left;   FRONTAL SINUS EXPLORATION Left 09/16/2021   Procedure: LEFT FRONTAL SINUS EXPLORATION;  Surgeon:  Christia Reading, MD;  Location: [facility] OR;  Service: ENT;  Laterality: Left;   MAXILLARY ANTROSTOMY Left 09/16/2021   Procedure: LEFT MAXILLARY ANTROSTOMY;  Surgeon: Christia Reading, MD;  Location: [facility] OR;  Service: ENT;  Laterality: Left;   RETINAL DETACHMENT SURGERY     SINUS ENDO WITH FUSION Left 09/16/2021   Procedure: LEFT SINUS ENDO WITH FUSION;  Surgeon: Christia Reading, MD;  Location: [facility] OR;  Service: ENT;  Laterality: Left;   TONSILLECTOMY AND ADENOIDECTOMY     VASECTOMY  05/23/1987    Current Outpatient Medications  Medication Sig Dispense Refill   fexofenadine (ALLEGRA) 180 MG tablet Take 180 mg by mouth daily.     Multiple Vitamins-Minerals (MULTIVITAMIN & MINERAL PO) Take 1 tablet by mouth daily.     Multiple Vitamins-Minerals (PRESERVISION AREDS 2+MULTI VIT PO) Take 1 capsule by mouth in the morning and at bedtime.     rosuvastatin (CRESTOR) 20 MG tablet Take 20 mg by mouth daily.     traMADol (ULTRAM) 50 MG tablet Take 50 mg by mouth every 6 (six) hours as needed for moderate pain or severe pain.   4   traZODone (DESYREL) 100 MG tablet Take 100 mg by mouth at bedtime.   11   apixaban (ELIQUIS) 5 MG TABS tablet Take 1 tablet (5 mg total) by mouth 2 (two) times daily. 180 tablet 2   flecainide (TAMBOCOR) 100 MG tablet Take  1 tablet (100 mg total) by mouth 2 (two) times daily. 180 tablet 2   ketorolac (ACULAR) 0.5 % ophthalmic solution Place 1 drop into the right eye in the morning and at bedtime. 3 mL 11   metoprolol succinate (TOPROL-XL) 25 MG 24 hr tablet TAKE 1/2 TABLET BY MOUTH DAILY 45 tablet 2   No current facility-administered medications for this encounter.    No Known Allergies  Social History   Socioeconomic History   Marital status: Married    Spouse name: Not on file   Number of children: Not on file   Years of education: Not on file   Highest education level: Not on file  Occupational History   Not on file  Tobacco Use   Smoking status: Never   Smokeless tobacco:  Never  Vaping Use   Vaping Use: Never used  Substance and Sexual Activity   Alcohol use: Yes    Alcohol/week: 1.0 - 2.0 standard drink of alcohol    Types: 1 - 2 Standard drinks or equivalent per week   Drug use: Never   Sexual activity: Not on file  Other Topics Concern   Not on file  Social History Narrative   Not on file   Social Determinants of Health   Financial Resource Strain: Not on file  Food Insecurity: Not on file  Transportation Needs: Not on file  Physical Activity: Not on file  Stress: Not on file  Social Connections: Not on file  Intimate Partner Violence: Not on file    Family History  Problem Relation Age of Onset   Breast cancer Mother    Prostate cancer Father    COPD Father    Lung disease Father     ROS- All systems are reviewed and negative except as per the HPI above  Physical Exam: Vitals:   09/01/22 0838  BP: 116/72  Pulse: (!) 54  Weight: 70.3 kg  Height:  (1.727 m)    GEN- The patient is well appearing, alert and oriented x 3 today.   Head- normocephalic, atraumatic Eyes-  Sclera clear, conjunctiva pink Ears- hearing intact Oropharynx- clear Neck- supple, no JVP Lymph- no cervical lymphadenopathy Lungs- Clear to ausculation bilaterally, normal work of breathing Heart- Regular rate and rhythm, no murmurs, rubs or gallops, PMI not laterally displaced GI- soft, NT, ND, + BS Extremities- no clubbing, cyanosis, or edema MS- no significant deformity or atrophy Skin- no rash or lesion Psych- euthymic mood, full affect Neuro- strength and sensation are intact   EKG today demonstrates Sinus bradycardia HR 55 PR 188 ms QRS 102 ms QT/Qtc 480/459 ms   Epic records reviewed   CHA2DS2-VASc Score = 3  The patient's score is based upon: CHF History: 0 HTN History: 0 Diabetes History: 0 Stroke History: 2 Vascular Disease History: 0 Age Score: 1 Gender Score: 0       ASSESSMENT AND PLAN: 1. Persistent Atrial  Fibrillation (ICD10:  I48.19) The patient's CHA2DS2-VASc score is 3, indicating a 3.2% annual risk of stroke.    He is in SR today and doing well overall. PR and QRS intervals are stable compared to previous. We will continue medication regimen without change.  Continue flecainide 100 mg BID Continue Toprol 12.5 mg daily Continue Eliquis 5 mg BID  2. Secondary Hypercoagulable State (ICD10:  D68.69) The patient is at significant risk for stroke/thromboembolism based upon his CHA2DS2-VASc Score of 3.  Continue Apixaban (Eliquis).   No missed doses and no bleeding concerns.  3. HTN Stable today, no changes at this time.   4. Snoring concern for obstructive sleep apnea Due to wife's report of snoring and patient feeling tired sometimes even after waking up, will order sleep study.   Follow up in the AF clinic in 6 months.    [place]ake Bells, PA-C [facility] [address] [place], [place] [address] [phone]

## 2022-09-20 ENCOUNTER — Other Ambulatory Visit (HOSPITAL_COMMUNITY): Payer: Self-pay | Admitting: Physician Assistant

## 2022-10-19 DIAGNOSIS — R972 Elevated prostate specific antigen [PSA]: Secondary | ICD-10-CM | POA: Diagnosis not present

## 2022-10-24 DIAGNOSIS — H353132 Nonexudative age-related macular degeneration, bilateral, intermediate dry stage: Secondary | ICD-10-CM | POA: Diagnosis not present

## 2022-10-24 DIAGNOSIS — H35351 Cystoid macular degeneration, right eye: Secondary | ICD-10-CM | POA: Diagnosis not present

## 2022-10-24 DIAGNOSIS — H35372 Puckering of macula, left eye: Secondary | ICD-10-CM | POA: Diagnosis not present

## 2022-11-29 DIAGNOSIS — H59813 Chorioretinal scars after surgery for detachment, bilateral: Secondary | ICD-10-CM | POA: Diagnosis not present

## 2022-11-29 DIAGNOSIS — H35372 Puckering of macula, left eye: Secondary | ICD-10-CM | POA: Diagnosis not present

## 2022-11-29 DIAGNOSIS — H353132 Nonexudative age-related macular degeneration, bilateral, intermediate dry stage: Secondary | ICD-10-CM | POA: Diagnosis not present

## 2022-11-29 DIAGNOSIS — Z961 Presence of intraocular lens: Secondary | ICD-10-CM | POA: Diagnosis not present

## 2022-12-18 IMAGING — MR MR HEAD WO/W CM
13 of 14 series · 44 of 48 positions shown · IV contrast (multihance)
Comparison: None.

CLINICAL DATA: Abnormal noise right ear over the last 4 months with
hearing loss.

EXAM:
MRI HEAD WITHOUT AND WITH CONTRAST
TECHNIQUE: Multiplanar, multiecho pulse sequences of the brain and surrounding
structures were obtained without and with intravenous contrast.
CONTRAST:  12mL MULTIHANCE GADOBENATE DIMEGLUMINE 529 MG/ML IV SOLN

[Series 5: T1 · sagittal · 4.0mm · 0.75mm/px · 1 of 30 slices shown (1 of 3)]
[im 1/30]
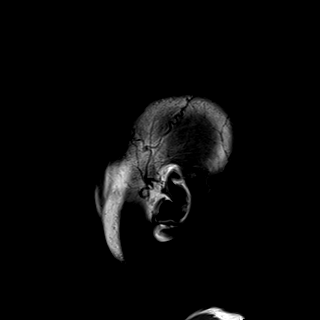

[Series 6: DWI · axial · 3.0mm · 0.94mm/px · z∈[-93,+60]mm · 10 of 176 slices shown]
[im 1/176]
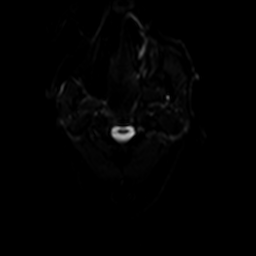
[im 20/176]
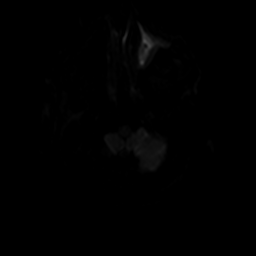
[im 39/176]
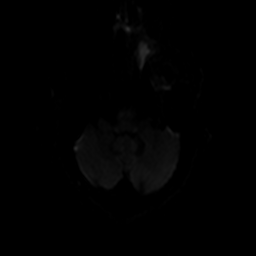
[im 59/176]
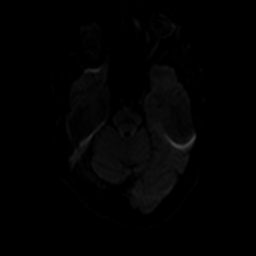
[im 78/176]
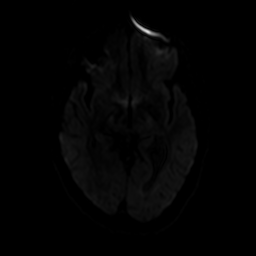
[im 98/176]
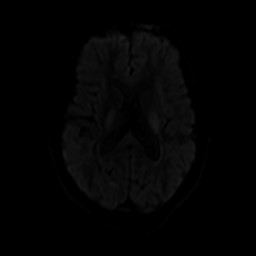
[im 117/176]
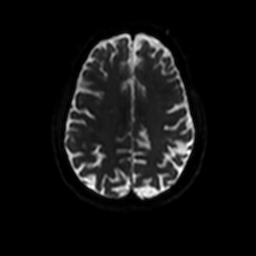
[im 137/176]
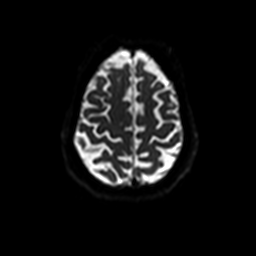
[im 156/176]
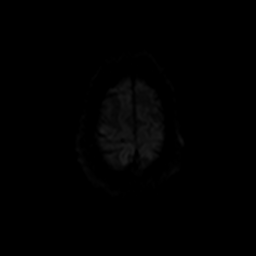
[im 176/176]
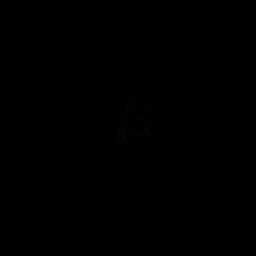

[Series 7: ax dwi_tracew · axial · 3.0mm · 0.94mm/px · z∈[-93,+60]mm · 5 of 88 slices shown]
[im 1/88]
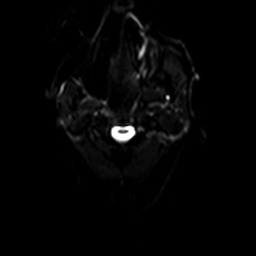
[im 22/88]
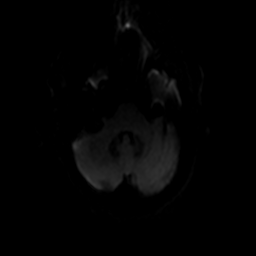
[im 44/88]
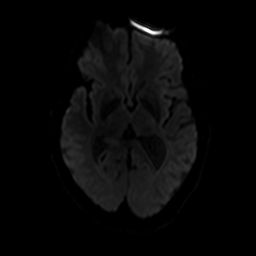
[im 66/88]
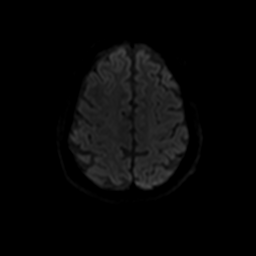
[im 88/88]
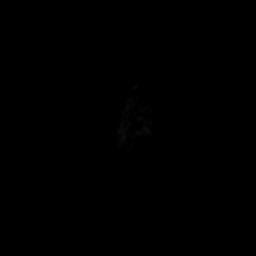

[Series 8: ax dwi_adc · axial · 3.0mm · 0.94mm/px · z∈[-93,+60]mm · 3 of 44 slices shown]
[im 1/44]
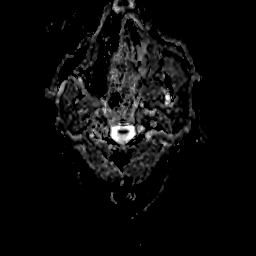
[im 22/44]
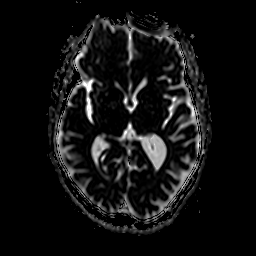
[im 44/44]
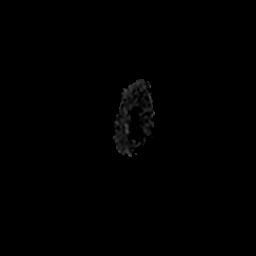

[Series 9: T2 · axial · 4.0mm · 0.36mm/px · z∈[-84,+66]mm · 2 of 30 slices shown (1 of 2)]
[im 1/30]
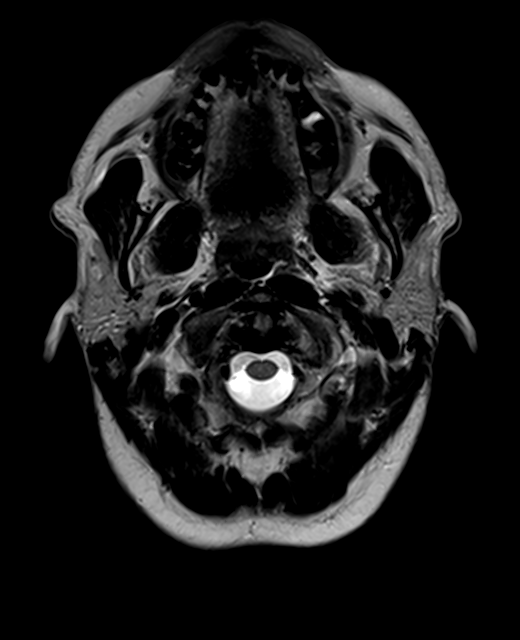
[im 30/30]
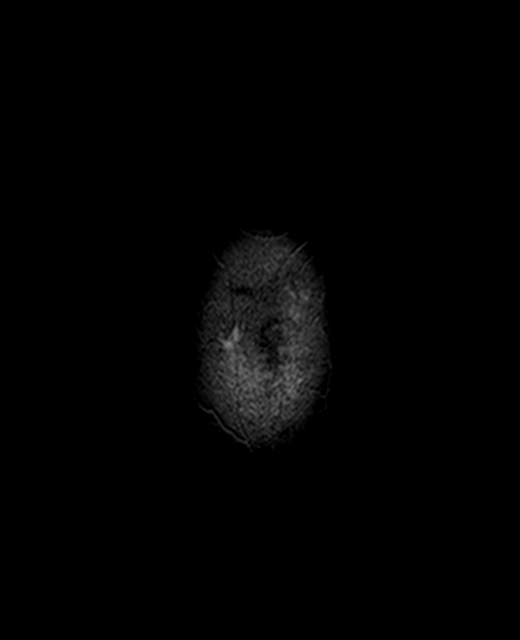

[Series 10: FLAIR · axial · 3.0mm · 0.72mm/px · z∈[-84,+65]mm · 2 of 26 slices shown]
[im 1/26]
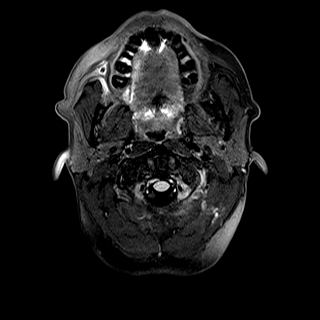
[im 26/26]
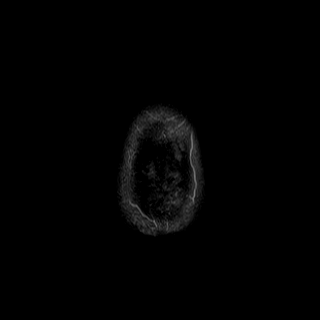

[Series 11: swi_images · axial · 1.5mm · 0.90mm/px · z∈[-79,+62]mm · 6 of 96 slices shown]
[im 1/96]
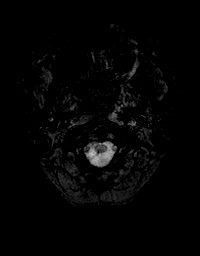
[im 20/96]
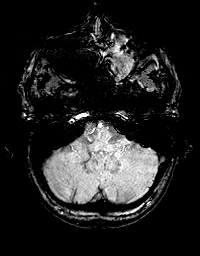
[im 39/96]
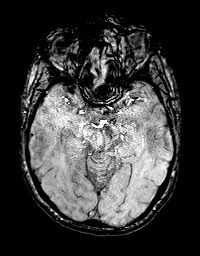
[im 58/96]
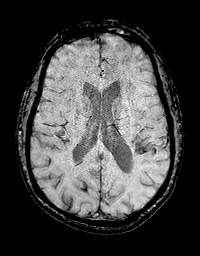
[im 77/96]
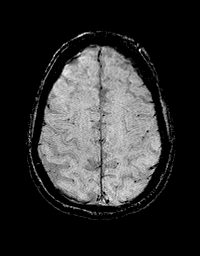
[im 96/96]
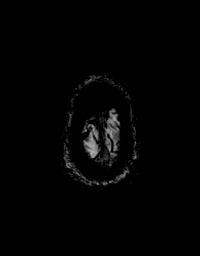

[Series 13: T1 · coronal · 3.0mm · 0.56mm/px · 1 of 13 slices shown (2 of 3)]
[im 1/13]
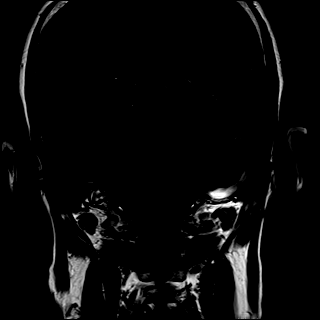

[Series 14: T2 · coronal · 2.0mm · 0.56mm/px · 1 of 13 slices shown (2 of 2)]
[im 1/13]
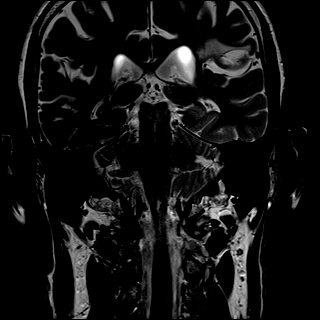

[Series 15: T1 · axial · 3.0mm · 0.50mm/px · 1 of 13 slices shown (3 of 3)]
[im 1/13]
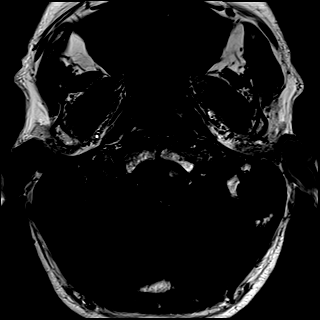

[Series 17: T1 post-contrast · coronal · 3.0mm · 0.56mm/px · 1 of 13 slices shown (1 of 3)]
[im 1/13]
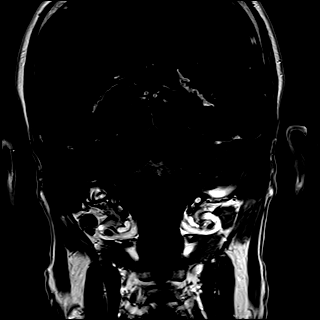

[Series 18: T1 post-contrast · axial · 3.0mm · 0.50mm/px · 1 of 13 slices shown (2 of 3)]
[im 1/13]
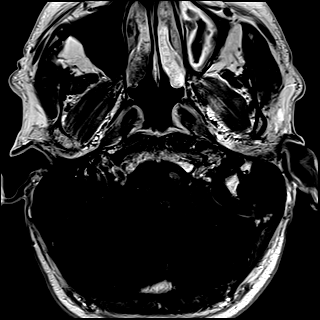

[Series 19: T1 post-contrast · axial · 1.0mm · 0.90mm/px · z∈[-86,+72]mm · 10 of 160 slices shown (3 of 3)]
[im 1/160]
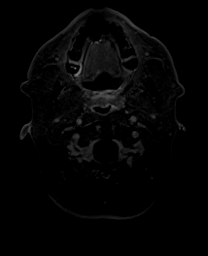
[im 18/160]
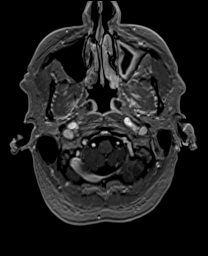
[im 36/160]
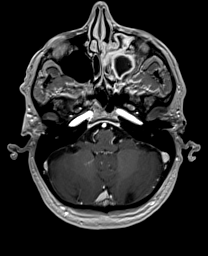
[im 54/160]
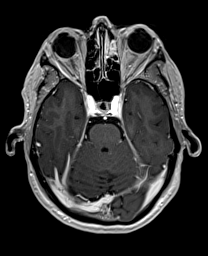
[im 71/160]
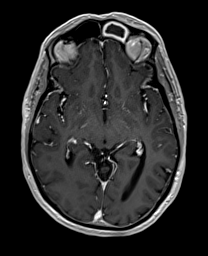
[im 89/160]
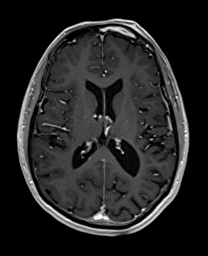
[im 107/160]
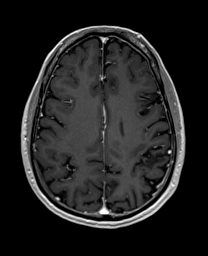
[im 124/160]
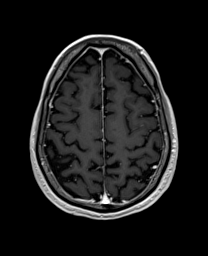
[im 142/160]
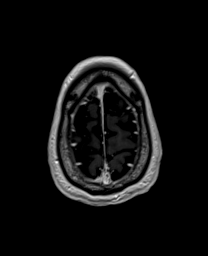
[im 160/160]
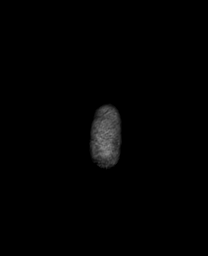

[44 of 48 positions shown; findings below may reference images not displayed]

FINDINGS: Brain: Diffusion imaging does not show any acute or subacute
infarction. The brainstem and cerebellum are normal. CP angle
regions are normal. No vestibular schwannoma or enhancing neuritis.
Cerebral hemispheres show an old left parietal cortical and
subcortical infarction with volume loss and gliosis. Minimal
small-vessel change elsewhere within the hemispheric white matter.
No mass, hemorrhage, hydrocephalus or extra-axial collection. Mild
hemosiderin deposition in the region of the old left parietal
infarction. After contrast administration, no abnormal enhancement
occurs.

Vascular: Major vessels at the base of the brain show flow.

Skull and upper cervical spine: Negative

Sinuses/Orbits: Paranasal sinuses on the right are clear.
Ostiomeatal unit pattern on the left with complete opacification of
the left frontal sinus, anterior ethmoid sinuses and maxillary
sinus. No acute orbital finding. Previous scleral band on the right.
Previous lens implants.

Other: None
IMPRESSION: No cause right-sided hearing loss and abnormal sound identified. No
vestibular schwannoma or enhancing neuritis. No fluid in the middle
ear or mastoid. No abnormal vascular finding.

Ostiomeatal unit pattern sinusitis on the left with complete
opacification of the left frontal, anterior ethmoid and maxillary
sinuses.

Old left parietal cortical and subcortical infarction.

## 2022-12-29 DIAGNOSIS — H832X1 Labyrinthine dysfunction, right ear: Secondary | ICD-10-CM | POA: Diagnosis not present

## 2022-12-29 DIAGNOSIS — Z4889 Encounter for other specified surgical aftercare: Secondary | ICD-10-CM | POA: Diagnosis not present

## 2022-12-29 DIAGNOSIS — Z9621 Cochlear implant status: Secondary | ICD-10-CM | POA: Diagnosis not present

## 2022-12-29 DIAGNOSIS — H818X1 Other disorders of vestibular function, right ear: Secondary | ICD-10-CM | POA: Diagnosis not present

## 2022-12-29 DIAGNOSIS — H9311 Tinnitus, right ear: Secondary | ICD-10-CM | POA: Diagnosis not present

## 2022-12-29 DIAGNOSIS — H9121 Sudden idiopathic hearing loss, right ear: Secondary | ICD-10-CM | POA: Diagnosis not present

## 2022-12-29 DIAGNOSIS — Z45321 Encounter for adjustment and management of cochlear device: Secondary | ICD-10-CM | POA: Diagnosis not present

## 2022-12-29 DIAGNOSIS — H903 Sensorineural hearing loss, bilateral: Secondary | ICD-10-CM | POA: Diagnosis not present

## 2023-03-01 ENCOUNTER — Ambulatory Visit (HOSPITAL_COMMUNITY): Payer: Medicare Other | Admitting: Internal Medicine

## 2023-04-23 DIAGNOSIS — H35372 Puckering of macula, left eye: Secondary | ICD-10-CM | POA: Diagnosis not present

## 2023-04-23 DIAGNOSIS — H35351 Cystoid macular degeneration, right eye: Secondary | ICD-10-CM | POA: Diagnosis not present

## 2023-04-23 DIAGNOSIS — H35352 Cystoid macular degeneration, left eye: Secondary | ICD-10-CM | POA: Diagnosis not present

## 2023-04-23 DIAGNOSIS — Z9889 Other specified postprocedural states: Secondary | ICD-10-CM | POA: Diagnosis not present

## 2023-04-23 DIAGNOSIS — H353132 Nonexudative age-related macular degeneration, bilateral, intermediate dry stage: Secondary | ICD-10-CM | POA: Diagnosis not present

## 2023-04-23 DIAGNOSIS — H353221 Exudative age-related macular degeneration, left eye, with active choroidal neovascularization: Secondary | ICD-10-CM | POA: Diagnosis not present

## 2023-04-25 DIAGNOSIS — H353221 Exudative age-related macular degeneration, left eye, with active choroidal neovascularization: Secondary | ICD-10-CM | POA: Diagnosis not present

## 2023-05-30 DIAGNOSIS — H35353 Cystoid macular degeneration, bilateral: Secondary | ICD-10-CM | POA: Diagnosis not present

## 2023-05-30 DIAGNOSIS — H35372 Puckering of macula, left eye: Secondary | ICD-10-CM | POA: Diagnosis not present

## 2023-05-30 DIAGNOSIS — H353221 Exudative age-related macular degeneration, left eye, with active choroidal neovascularization: Secondary | ICD-10-CM | POA: Diagnosis not present

## 2023-05-30 DIAGNOSIS — H353132 Nonexudative age-related macular degeneration, bilateral, intermediate dry stage: Secondary | ICD-10-CM | POA: Diagnosis not present

## 2023-06-28 ENCOUNTER — Other Ambulatory Visit (HOSPITAL_COMMUNITY): Payer: Self-pay | Admitting: Internal Medicine

## 2023-06-29 ENCOUNTER — Other Ambulatory Visit (HOSPITAL_COMMUNITY): Payer: Self-pay | Admitting: Internal Medicine

## 2023-07-04 DIAGNOSIS — Z9889 Other specified postprocedural states: Secondary | ICD-10-CM | POA: Diagnosis not present

## 2023-07-04 DIAGNOSIS — H353132 Nonexudative age-related macular degeneration, bilateral, intermediate dry stage: Secondary | ICD-10-CM | POA: Diagnosis not present

## 2023-07-04 DIAGNOSIS — H35352 Cystoid macular degeneration, left eye: Secondary | ICD-10-CM | POA: Diagnosis not present

## 2023-07-04 DIAGNOSIS — H353221 Exudative age-related macular degeneration, left eye, with active choroidal neovascularization: Secondary | ICD-10-CM | POA: Diagnosis not present

## 2023-07-04 DIAGNOSIS — H35351 Cystoid macular degeneration, right eye: Secondary | ICD-10-CM | POA: Diagnosis not present

## 2023-07-04 DIAGNOSIS — H35372 Puckering of macula, left eye: Secondary | ICD-10-CM | POA: Diagnosis not present

## 2023-07-11 DIAGNOSIS — Z9621 Cochlear implant status: Secondary | ICD-10-CM | POA: Diagnosis not present

## 2023-07-11 DIAGNOSIS — H9193 Unspecified hearing loss, bilateral: Secondary | ICD-10-CM | POA: Diagnosis not present

## 2023-07-16 DIAGNOSIS — L821 Other seborrheic keratosis: Secondary | ICD-10-CM | POA: Diagnosis not present

## 2023-07-16 DIAGNOSIS — D1801 Hemangioma of skin and subcutaneous tissue: Secondary | ICD-10-CM | POA: Diagnosis not present

## 2023-07-16 DIAGNOSIS — L812 Freckles: Secondary | ICD-10-CM | POA: Diagnosis not present

## 2023-07-16 DIAGNOSIS — Z85828 Personal history of other malignant neoplasm of skin: Secondary | ICD-10-CM | POA: Diagnosis not present

## 2023-07-16 DIAGNOSIS — I788 Other diseases of capillaries: Secondary | ICD-10-CM | POA: Diagnosis not present

## 2023-07-16 DIAGNOSIS — L570 Actinic keratosis: Secondary | ICD-10-CM | POA: Diagnosis not present

## 2023-07-21 ENCOUNTER — Other Ambulatory Visit (HOSPITAL_COMMUNITY): Payer: Self-pay | Admitting: Internal Medicine

## 2023-08-13 ENCOUNTER — Other Ambulatory Visit (HOSPITAL_COMMUNITY): Payer: Self-pay | Admitting: Internal Medicine

## 2023-08-15 DIAGNOSIS — H353221 Exudative age-related macular degeneration, left eye, with active choroidal neovascularization: Secondary | ICD-10-CM | POA: Diagnosis not present

## 2023-08-15 DIAGNOSIS — H353132 Nonexudative age-related macular degeneration, bilateral, intermediate dry stage: Secondary | ICD-10-CM | POA: Diagnosis not present

## 2023-08-15 DIAGNOSIS — H35353 Cystoid macular degeneration, bilateral: Secondary | ICD-10-CM | POA: Diagnosis not present

## 2023-08-15 DIAGNOSIS — H35372 Puckering of macula, left eye: Secondary | ICD-10-CM | POA: Diagnosis not present

## 2023-08-22 ENCOUNTER — Other Ambulatory Visit (HOSPITAL_COMMUNITY): Payer: Self-pay | Admitting: Internal Medicine

## 2023-09-10 ENCOUNTER — Other Ambulatory Visit (HOSPITAL_COMMUNITY): Payer: Self-pay | Admitting: Internal Medicine

## 2023-09-11 DIAGNOSIS — R972 Elevated prostate specific antigen [PSA]: Secondary | ICD-10-CM | POA: Diagnosis not present

## 2023-09-11 DIAGNOSIS — E785 Hyperlipidemia, unspecified: Secondary | ICD-10-CM | POA: Diagnosis not present

## 2023-09-18 DIAGNOSIS — Z7901 Long term (current) use of anticoagulants: Secondary | ICD-10-CM | POA: Diagnosis not present

## 2023-09-18 DIAGNOSIS — E785 Hyperlipidemia, unspecified: Secondary | ICD-10-CM | POA: Diagnosis not present

## 2023-09-18 DIAGNOSIS — Z1339 Encounter for screening examination for other mental health and behavioral disorders: Secondary | ICD-10-CM | POA: Diagnosis not present

## 2023-09-18 DIAGNOSIS — N1831 Chronic kidney disease, stage 3a: Secondary | ICD-10-CM | POA: Diagnosis not present

## 2023-09-18 DIAGNOSIS — R82998 Other abnormal findings in urine: Secondary | ICD-10-CM | POA: Diagnosis not present

## 2023-09-18 DIAGNOSIS — Z0000 Encounter for general adult medical examination without abnormal findings: Secondary | ICD-10-CM | POA: Diagnosis not present

## 2023-09-18 DIAGNOSIS — R972 Elevated prostate specific antigen [PSA]: Secondary | ICD-10-CM | POA: Diagnosis not present

## 2023-09-18 DIAGNOSIS — J309 Allergic rhinitis, unspecified: Secondary | ICD-10-CM | POA: Diagnosis not present

## 2023-09-18 DIAGNOSIS — I480 Paroxysmal atrial fibrillation: Secondary | ICD-10-CM | POA: Diagnosis not present

## 2023-09-18 DIAGNOSIS — Z9621 Cochlear implant status: Secondary | ICD-10-CM | POA: Diagnosis not present

## 2023-09-18 DIAGNOSIS — Z1331 Encounter for screening for depression: Secondary | ICD-10-CM | POA: Diagnosis not present

## 2023-09-19 ENCOUNTER — Other Ambulatory Visit (HOSPITAL_COMMUNITY): Payer: Self-pay

## 2023-09-19 MED ORDER — APIXABAN 5 MG PO TABS: 5.0000 mg | ORAL_TABLET | Freq: Two times a day (BID) | ORAL | 0 refills | Status: DC

## 2023-09-25 ENCOUNTER — Other Ambulatory Visit (HOSPITAL_COMMUNITY): Payer: Self-pay | Admitting: Internal Medicine

## 2023-09-25 ENCOUNTER — Ambulatory Visit (HOSPITAL_COMMUNITY)
Admission: RE | Admit: 2023-09-25 | Discharge: 2023-09-25 | Disposition: A | Discharge status: Home / Self Care | Source: Ambulatory Visit | Attending: Internal Medicine | Admitting: Internal Medicine

## 2023-09-25 VITALS — BP 114/76 | HR 55 | Ht 68.0 in | Wt 151.2 lb

## 2023-09-25 DIAGNOSIS — Z79899 Other long term (current) drug therapy: Secondary | ICD-10-CM | POA: Diagnosis not present

## 2023-09-25 DIAGNOSIS — Z5181 Encounter for therapeutic drug level monitoring: Secondary | ICD-10-CM | POA: Insufficient documentation

## 2023-09-25 DIAGNOSIS — D6869 Other thrombophilia: Secondary | ICD-10-CM | POA: Insufficient documentation

## 2023-09-25 DIAGNOSIS — I4819 Other persistent atrial fibrillation: Secondary | ICD-10-CM | POA: Diagnosis not present

## 2023-09-25 DIAGNOSIS — I4891 Unspecified atrial fibrillation: Secondary | ICD-10-CM | POA: Insufficient documentation

## 2023-09-25 MED ORDER — METOPROLOL SUCCINATE ER 25 MG PO TB24: 12.5000 mg | ORAL_TABLET | Freq: Every day | ORAL | 6 refills | Status: DC

## 2023-09-25 MED ORDER — APIXABAN 5 MG PO TABS: 5.0000 mg | ORAL_TABLET | Freq: Two times a day (BID) | ORAL | 6 refills | Status: DC

## 2023-09-25 MED ORDER — FLECAINIDE ACETATE 100 MG PO TABS: 100.0000 mg | ORAL_TABLET | Freq: Two times a day (BID) | ORAL | 6 refills | Status: DC

## 2023-09-25 NOTE — Progress Notes (Signed)
 Primary Care Physician: Bertha Broad, MD Referring Physician: Dr. Garfield Jungling is a 73 y.o. male with a h/o HLD, CVA, and atrial fibrillation who presents for follow up in the [facility]. Patient underwent DCCV in 2016 which was unsuccessful. He was started on flecainide  and converted chemically. He has been maintained on flecainide . Patient is on Eliquis  for a CHADS2VASC score of 3.  On follow up 09/25/23, he is here for flecainide  surveillance. He is currently in NSR. He has had possibly 1 episode of Afib since last office visit. He is doing well overall with low burden. No missed doses of Eliquis .   Today, he denies symptoms of palpitations, chest pain, shortness of breath, orthopnea, PND, lower extremity edema, dizziness, presyncope, syncope, or neurologic sequela. The patient is tolerating medications without difficulties and is otherwise without complaint today.   Past Medical History:  Diagnosis Date   A-fib [facility])    Arthritis    Back pain    Dyspnea    Dysrhythmia    ED (erectile dysfunction)    GERD (gastroesophageal reflux disease)    Hearing loss    Hemorrhoids    Hyperlipidemia    Insomnia    Nocturia    PSA elevation    Rhinitis    Shoulder pain, bilateral    Urinary hesitancy    Vertigo    Vestibular neuropathy, right    Past Surgical History:  Procedure Laterality Date   CARDIOVERSION N/A 11/16/2014   Procedure: CARDIOVERSION;  Surgeon: Luana Rumple, MD;  [facility];  Service: Cardiovascular;  Laterality: N/A;   CATARACT EXTRACTION     COLONOSCOPY     DENTAL IMPLANT     ETHMOIDECTOMY Left 09/16/2021   Procedure: LEFT ANTERIOR ETHMOIDECTOMY;  Surgeon: Virgina Grills, MD;  Location: [facility] OR;  Service: ENT;  Laterality: Left;   FRONTAL SINUS EXPLORATION Left 09/16/2021   Procedure: LEFT FRONTAL SINUS EXPLORATION;  Surgeon: Virgina Grills, MD;  Location: [facility] OR;  Service: ENT;  Laterality: Left;    MAXILLARY ANTROSTOMY Left 09/16/2021   Procedure: LEFT MAXILLARY ANTROSTOMY;  Surgeon: Virgina Grills, MD;  Location: [facility] OR;  Service: ENT;  Laterality: Left;   RETINAL DETACHMENT SURGERY     SINUS ENDO WITH FUSION Left 09/16/2021   Procedure: LEFT SINUS ENDO WITH FUSION;  Surgeon: Virgina Grills, MD;  Location: [facility] OR;  Service: ENT;  Laterality: Left;   TONSILLECTOMY AND ADENOIDECTOMY     VASECTOMY  05/23/1987    Current Outpatient Medications  Medication Sig Dispense Refill   acetaminophen  (TYLENOL ) 500 MG tablet Take 1,000 mg by mouth as needed.     apixaban  (ELIQUIS ) 5 MG TABS tablet Take 1 tablet (5 mg total) by mouth 2 (two) times daily. 30 tablet 0   B Complex Vitamins (VITAMIN B-COMPLEX) TABS Take 1 tablet by mouth daily.     cholecalciferol (VITAMIN D3) 25 MCG (1000 UNIT) tablet Take 200 Units by mouth every morning.     flecainide  (TAMBOCOR ) 100 MG tablet Take 1 tablet (100 mg total) by mouth 2 (two) times daily. Appointment Required For Further Refills [phone] 60 tablet 0   ketorolac (ACULAR) 0.5 % ophthalmic solution Place 1 drop into the right eye 2 (two) times daily.     loratadine (CLARITIN) 10 MG tablet Take 10 mg by mouth daily.     metoprolol  succinate (TOPROL -XL) 25 MG 24 hr tablet TAKE 1/2 TABLET BY MOUTH DAILY **APPT REQUIRED FOR  FUTURE REFILLS** 15 tablet 0   Multiple Vitamin (MULTIVITAMIN) tablet Take 1 tablet by mouth daily. Solgar # 7-For Joint Support     Multiple Vitamins-Minerals (MULTIVITAMIN & MINERAL PO) Take 1 tablet by mouth daily.     Multiple Vitamins-Minerals (PRESERVISION AREDS 2+MULTI VIT PO) Take 1 capsule by mouth in the morning and at bedtime.     rosuvastatin (CRESTOR) 20 MG tablet Take 20 mg by mouth daily.     traMADol (ULTRAM) 50 MG tablet Take 50 mg by mouth every 6 (six) hours as needed for moderate pain or severe pain.   4   traZODone (DESYREL) 100 MG tablet Take 100 mg by mouth at bedtime.   11   No current facility-administered medications  for this encounter.    No Known Allergies  ROS- All systems are reviewed and negative except as per the HPI above  Physical Exam: Vitals:   09/25/23 0933  BP: 114/76  Pulse: (!) 55  Weight: 68.6 kg  Height: 5\' 8"  (1.727 m)    GEN- The patient is well appearing, alert and oriented x 3 today.   Neck - no JVD or carotid bruit noted Lungs- Clear to ausculation bilaterally, normal work of breathing Heart- Regular rate and rhythm, no murmurs, rubs or gallops, PMI not laterally displaced Extremities- no clubbing, cyanosis, or edema Skin - no rash or ecchymosis noted  EKG today demonstrates Vent. rate 55 BPM PR interval 178 ms QRS duration 108 ms QT/QTcB 466/445 ms P-R-T axes 76 91 77 Sinus bradycardia Rightward axis Borderline ECG When compared with ECG of 01-Sep-2022 08:45, No significant change was found  ECHO 2016 showed normal LV function  Epic records reviewed  CHA2DS2-VASc Score = 3  The patient's score is based upon: CHF History: 0 HTN History: 0 Diabetes History: 0 Stroke History: 2 Vascular Disease History: 0 Age Score: 1 Gender Score: 0        ASSESSMENT AND PLAN: 1. Persistent Atrial Fibrillation (ICD10:  I48.19) The patient's CHA2DS2-VASc score is 3, indicating a 3.2% annual risk of stroke.    He is currently in NSR. Continue current medication regimen without change. Recommended rhythm monitoring device.   High risk medication monitoring (ICD10: U5195107) Patient requires ongoing monitoring for anti-arrhythmic medication which has the potential to cause life threatening arrhythmias or AV block. ECG intervals are stable. Continue flecainide  100 mg BID.   2. Secondary Hypercoagulable State (ICD10:  D68.69) The patient is at significant risk for stroke/thromboembolism based upon his CHA2DS2-VASc Score of 3.  Continue Apixaban  (Eliquis ).   No missed doses.   3. HTN Stable today.     Follow up 6 months for flecainide  surveillance.    Minnie Amber, PA-C Afib Clinic [facility] [address] [place], [place] [address] [phone]

## 2023-10-01 ENCOUNTER — Other Ambulatory Visit (HOSPITAL_COMMUNITY): Payer: Self-pay | Admitting: *Deleted

## 2023-10-01 MED ORDER — APIXABAN 5 MG PO TABS: 5.0000 mg | ORAL_TABLET | Freq: Two times a day (BID) | ORAL | 6 refills | Status: DC

## 2023-10-03 DIAGNOSIS — H35373 Puckering of macula, bilateral: Secondary | ICD-10-CM | POA: Diagnosis not present

## 2023-10-03 DIAGNOSIS — H353132 Nonexudative age-related macular degeneration, bilateral, intermediate dry stage: Secondary | ICD-10-CM | POA: Diagnosis not present

## 2023-10-03 DIAGNOSIS — H35353 Cystoid macular degeneration, bilateral: Secondary | ICD-10-CM | POA: Diagnosis not present

## 2023-10-03 DIAGNOSIS — H353221 Exudative age-related macular degeneration, left eye, with active choroidal neovascularization: Secondary | ICD-10-CM | POA: Diagnosis not present

## 2023-10-24 ENCOUNTER — Other Ambulatory Visit (HOSPITAL_COMMUNITY): Payer: Self-pay | Admitting: Internal Medicine

## 2023-11-07 DIAGNOSIS — R972 Elevated prostate specific antigen [PSA]: Secondary | ICD-10-CM | POA: Diagnosis not present

## 2023-11-12 DIAGNOSIS — H353221 Exudative age-related macular degeneration, left eye, with active choroidal neovascularization: Secondary | ICD-10-CM | POA: Diagnosis not present

## 2023-11-12 DIAGNOSIS — H353132 Nonexudative age-related macular degeneration, bilateral, intermediate dry stage: Secondary | ICD-10-CM | POA: Diagnosis not present

## 2023-11-12 DIAGNOSIS — H35373 Puckering of macula, bilateral: Secondary | ICD-10-CM | POA: Diagnosis not present

## 2023-11-12 DIAGNOSIS — H35353 Cystoid macular degeneration, bilateral: Secondary | ICD-10-CM | POA: Diagnosis not present

## 2023-12-12 DIAGNOSIS — H353132 Nonexudative age-related macular degeneration, bilateral, intermediate dry stage: Secondary | ICD-10-CM | POA: Diagnosis not present

## 2023-12-12 DIAGNOSIS — H35353 Cystoid macular degeneration, bilateral: Secondary | ICD-10-CM | POA: Diagnosis not present

## 2023-12-12 DIAGNOSIS — H353221 Exudative age-related macular degeneration, left eye, with active choroidal neovascularization: Secondary | ICD-10-CM | POA: Diagnosis not present

## 2023-12-12 DIAGNOSIS — H35373 Puckering of macula, bilateral: Secondary | ICD-10-CM | POA: Diagnosis not present

## 2024-01-03 DIAGNOSIS — H353221 Exudative age-related macular degeneration, left eye, with active choroidal neovascularization: Secondary | ICD-10-CM | POA: Diagnosis not present

## 2024-01-03 DIAGNOSIS — H35372 Puckering of macula, left eye: Secondary | ICD-10-CM | POA: Diagnosis not present

## 2024-01-03 DIAGNOSIS — H353132 Nonexudative age-related macular degeneration, bilateral, intermediate dry stage: Secondary | ICD-10-CM | POA: Diagnosis not present

## 2024-01-03 DIAGNOSIS — Z961 Presence of intraocular lens: Secondary | ICD-10-CM | POA: Diagnosis not present

## 2024-01-03 DIAGNOSIS — H59813 Chorioretinal scars after surgery for detachment, bilateral: Secondary | ICD-10-CM | POA: Diagnosis not present

## 2024-01-04 DIAGNOSIS — H9311 Tinnitus, right ear: Secondary | ICD-10-CM | POA: Diagnosis not present

## 2024-01-04 DIAGNOSIS — Z4889 Encounter for other specified surgical aftercare: Secondary | ICD-10-CM | POA: Diagnosis not present

## 2024-01-04 DIAGNOSIS — H903 Sensorineural hearing loss, bilateral: Secondary | ICD-10-CM | POA: Diagnosis not present

## 2024-01-04 DIAGNOSIS — H832X1 Labyrinthine dysfunction, right ear: Secondary | ICD-10-CM | POA: Diagnosis not present

## 2024-01-04 DIAGNOSIS — Z9621 Cochlear implant status: Secondary | ICD-10-CM | POA: Diagnosis not present

## 2024-01-17 DIAGNOSIS — H35351 Cystoid macular degeneration, right eye: Secondary | ICD-10-CM | POA: Diagnosis not present

## 2024-01-17 DIAGNOSIS — H35372 Puckering of macula, left eye: Secondary | ICD-10-CM | POA: Diagnosis not present

## 2024-01-17 DIAGNOSIS — H35352 Cystoid macular degeneration, left eye: Secondary | ICD-10-CM | POA: Diagnosis not present

## 2024-01-17 DIAGNOSIS — H353221 Exudative age-related macular degeneration, left eye, with active choroidal neovascularization: Secondary | ICD-10-CM | POA: Diagnosis not present

## 2024-01-17 DIAGNOSIS — H353132 Nonexudative age-related macular degeneration, bilateral, intermediate dry stage: Secondary | ICD-10-CM | POA: Diagnosis not present

## 2024-01-17 DIAGNOSIS — H35371 Puckering of macula, right eye: Secondary | ICD-10-CM | POA: Diagnosis not present

## 2024-02-28 DIAGNOSIS — H35371 Puckering of macula, right eye: Secondary | ICD-10-CM | POA: Diagnosis not present

## 2024-02-28 DIAGNOSIS — H35352 Cystoid macular degeneration, left eye: Secondary | ICD-10-CM | POA: Diagnosis not present

## 2024-02-28 DIAGNOSIS — H353221 Exudative age-related macular degeneration, left eye, with active choroidal neovascularization: Secondary | ICD-10-CM | POA: Diagnosis not present

## 2024-02-28 DIAGNOSIS — H35351 Cystoid macular degeneration, right eye: Secondary | ICD-10-CM | POA: Diagnosis not present

## 2024-02-28 DIAGNOSIS — H35372 Puckering of macula, left eye: Secondary | ICD-10-CM | POA: Diagnosis not present

## 2024-02-28 DIAGNOSIS — H353132 Nonexudative age-related macular degeneration, bilateral, intermediate dry stage: Secondary | ICD-10-CM | POA: Diagnosis not present

## 2024-03-10 DIAGNOSIS — R3914 Feeling of incomplete bladder emptying: Secondary | ICD-10-CM | POA: Diagnosis not present

## 2024-03-10 DIAGNOSIS — R972 Elevated prostate specific antigen [PSA]: Secondary | ICD-10-CM | POA: Diagnosis not present

## 2024-03-27 ENCOUNTER — Encounter (HOSPITAL_COMMUNITY): Payer: Self-pay | Admitting: Internal Medicine

## 2024-03-27 ENCOUNTER — Ambulatory Visit (HOSPITAL_COMMUNITY)
Admission: RE | Admit: 2024-03-27 | Discharge: 2024-03-27 | Disposition: A | Discharge status: Home / Self Care | Source: Ambulatory Visit | Attending: Internal Medicine | Admitting: Internal Medicine

## 2024-03-27 VITALS — BP 122/78 | HR 49 | Ht 68.0 in | Wt 150.8 lb

## 2024-03-27 DIAGNOSIS — I480 Paroxysmal atrial fibrillation: Secondary | ICD-10-CM | POA: Insufficient documentation

## 2024-03-27 DIAGNOSIS — Z5181 Encounter for therapeutic drug level monitoring: Secondary | ICD-10-CM | POA: Insufficient documentation

## 2024-03-27 DIAGNOSIS — I4811 Longstanding persistent atrial fibrillation: Secondary | ICD-10-CM | POA: Insufficient documentation

## 2024-03-27 DIAGNOSIS — I4819 Other persistent atrial fibrillation: Secondary | ICD-10-CM | POA: Diagnosis not present

## 2024-03-27 DIAGNOSIS — D6869 Other thrombophilia: Secondary | ICD-10-CM | POA: Diagnosis not present

## 2024-03-27 DIAGNOSIS — Z79899 Other long term (current) drug therapy: Secondary | ICD-10-CM | POA: Insufficient documentation

## 2024-03-27 MED ORDER — APIXABAN 5 MG PO TABS: 5.0000 mg | ORAL_TABLET | Freq: Two times a day (BID) | ORAL | 6 refills | Status: AC

## 2024-03-27 MED ORDER — FLECAINIDE ACETATE 100 MG PO TABS: 100.0000 mg | ORAL_TABLET | Freq: Two times a day (BID) | ORAL | 6 refills | Status: AC

## 2024-03-27 NOTE — Progress Notes (Signed)
 Primary Care Physician: Yolande Toribio MATSU, MD Referring Physician: Dr. Fernande Lynwood KATHEE Robert Hart is a 73 y.o. male with a h/o HLD, CVA, and atrial fibrillation who presents for follow up in the [facility]. Patient underwent DCCV in 2016 which was unsuccessful. He was started on flecainide  and converted chemically. He has been maintained on flecainide . Patient is on Eliquis  for a CHADS2VASC score of 3.  On follow up 03/27/24, patient is here for flecainide  surveillance. He has overall no Afib burden since last office visit. No bleeding issues on Eliquis .   Today, he denies symptoms of palpitations, chest pain, shortness of breath, orthopnea, PND, lower extremity edema, dizziness, presyncope, syncope, or neurologic sequela. The patient is tolerating medications without difficulties and is otherwise without complaint today.   Past Medical History:  Diagnosis Date   A-fib [facility])    Arthritis    Back pain    Dyspnea    Dysrhythmia    ED (erectile dysfunction)    GERD (gastroesophageal reflux disease)    Hearing loss    Hemorrhoids    Hyperlipidemia    Insomnia    Nocturia    PSA elevation    Rhinitis    Shoulder pain, bilateral    Urinary hesitancy    Vertigo    Vestibular neuropathy, right    Past Surgical History:  Procedure Laterality Date   CARDIOVERSION N/A 11/16/2014   Procedure: CARDIOVERSION;  Surgeon: Jerel Balding, MD;  Location: [facility];  Service: Cardiovascular;  Laterality: N/A;   CATARACT EXTRACTION     COLONOSCOPY     DENTAL IMPLANT     ETHMOIDECTOMY Left 09/16/2021   Procedure: LEFT ANTERIOR ETHMOIDECTOMY;  Surgeon: Carlie Clark, MD;  Location: [facility] OR;  Service: ENT;  Laterality: Left;   FRONTAL SINUS EXPLORATION Left 09/16/2021   Procedure: LEFT FRONTAL SINUS EXPLORATION;  Surgeon: Carlie Clark, MD;  Location: [facility] OR;  Service: ENT;  Laterality: Left;   MAXILLARY ANTROSTOMY Left 09/16/2021   Procedure: LEFT MAXILLARY  ANTROSTOMY;  Surgeon: Carlie Clark, MD;  Location: [facility] OR;  Service: ENT;  Laterality: Left;   RETINAL DETACHMENT SURGERY     SINUS ENDO WITH FUSION Left 09/16/2021   Procedure: LEFT SINUS ENDO WITH FUSION;  Surgeon: Carlie Clark, MD;  Location: [facility] OR;  Service: ENT;  Laterality: Left;   TONSILLECTOMY AND ADENOIDECTOMY     VASECTOMY  05/23/1987    Current Outpatient Medications  Medication Sig Dispense Refill   acetaminophen  (TYLENOL ) 500 MG tablet Take 1,000 mg by mouth as needed.     B Complex Vitamins (VITAMIN B-COMPLEX) TABS Take 1 tablet by mouth daily.     cholecalciferol (VITAMIN D3) 25 MCG (1000 UNIT) tablet Take 200 Units by mouth every morning.     ketorolac (ACULAR) 0.5 % ophthalmic solution Place 1 drop into the right eye 2 (two) times daily.     loratadine (CLARITIN) 10 MG tablet Take 10 mg by mouth daily.     metoprolol  succinate (TOPROL -XL) 25 MG 24 hr tablet Take 0.5 tablets (12.5 mg total) by mouth daily. 45 tablet 3   Multiple Vitamin (MULTIVITAMIN) tablet Take 1 tablet by mouth daily. Solgar # 7-For Joint Support     Multiple Vitamins-Minerals (MULTIVITAMIN & MINERAL PO) Take 1 tablet by mouth daily.     Multiple Vitamins-Minerals (PRESERVISION AREDS 2+MULTI VIT PO) Take 1 capsule by mouth in the morning and at bedtime.     rosuvastatin (CRESTOR) 20 MG  tablet Take 20 mg by mouth daily.     traMADol (ULTRAM) 50 MG tablet Take 50 mg by mouth every 6 (six) hours as needed for moderate pain or severe pain.   4   traZODone (DESYREL) 100 MG tablet Take 100 mg by mouth at bedtime.   11   apixaban  (ELIQUIS ) 5 MG TABS tablet Take 1 tablet (5 mg total) by mouth 2 (two) times daily. 60 tablet 6   flecainide  (TAMBOCOR ) 100 MG tablet Take 1 tablet (100 mg total) by mouth 2 (two) times daily. 60 tablet 6   No current facility-administered medications for this encounter.    No Known Allergies  ROS- All systems are reviewed and negative except as per the HPI above  Physical  Exam: Vitals:   03/27/24 1005  BP: 122/78  Pulse: (!) 49  Weight: 68.4 kg  Height: 5' 8 (1.727 m)    GEN- The patient is well appearing, alert and oriented x 3 today.   Neck - no JVD or carotid bruit noted Lungs- Clear to ausculation bilaterally, normal work of breathing Heart- Regular rate and rhythm, no murmurs, rubs or gallops, PMI not laterally displaced Extremities- no clubbing, cyanosis, or edema Skin - no rash or ecchymosis noted   EKG today demonstrates Vent. rate 49 BPM PR interval 162 ms QRS duration 110 ms QT/QTcB 482/435 ms P-R-T axes 66 94 79 Sinus bradycardia Rightward axis Borderline ECG When compared with ECG of 25-Sep-2023 09:47, No significant change was found  ECHO 2016 showed normal LV function  Epic records reviewed  CHA2DS2-VASc Score = 3  The patient's score is based upon: CHF History: 0 HTN History: 0 Diabetes History: 0 Stroke History: 2 Vascular Disease History: 0 Age Score: 1 Gender Score: 0      ASSESSMENT AND PLAN: 1. Persistent Atrial Fibrillation (ICD10:  I48.19) The patient's CHA2DS2-VASc score is 3, indicating a 3.2% annual risk of stroke.    Patient appears to be maintaining SR. Continue Toprol  12.5 mg daily.  High risk medication monitoring (ICD10: U5195107) Patient requires ongoing monitoring for anti-arrhythmic medication which has the potential to cause life threatening arrhythmias or AV block. ECG intervals are stable. Continue flecainide  100 mg BID.   2. Secondary Hypercoagulable State (ICD10:  D68.69) The patient is at significant risk for stroke/thromboembolism based upon his CHA2DS2-VASc Score of 3.  Continue Apixaban  (Eliquis ).  No missed doses.   3. HTN Stable today.     Follow up 6 months for flecainide  surveillance.    Fairy Heinrich, PA-C Afib Clinic [facility] [address] [place], [place] [address] [phone]

## 2024-03-28 ENCOUNTER — Ambulatory Visit (HOSPITAL_COMMUNITY): Payer: Self-pay | Admitting: Internal Medicine

## 2024-03-28 LAB — BASIC METABOLIC PANEL WITH GFR
BUN/Creatinine Ratio: 17 (ref 10–24)
BUN: 21 mg/dL (ref 8–27)
CO2: 25 mmol/L (ref 20–29)
Calcium: 9.5 mg/dL (ref 8.6–10.2)
Chloride: 100 mmol/L (ref 96–106)
Creatinine, Ser: 1.23 mg/dL (ref 0.76–1.27)
Glucose: 88 mg/dL (ref 70–99)
Potassium: 4.5 mmol/L (ref 3.5–5.2)
Sodium: 138 mmol/L (ref 134–144)
eGFR: 62 mL/min/1.73 (ref >59)

## 2024-03-28 LAB — CBC
Hematocrit: 38.2 % (ref 37.5–51.0)
Hemoglobin: 12.5 g/dL — ABNORMAL LOW (ref 13.0–17.7)
MCH: 30.9 pg (ref 26.6–33.0)
MCHC: 32.7 g/dL (ref 31.5–35.7)
MCV: 94 fL (ref 79–97)
Platelets: 177 x10E3/uL (ref 150–450)
RBC: 4.05 x10E6/uL — ABNORMAL LOW (ref 4.14–5.80)
RDW: 12.2 % (ref 11.6–15.4)
WBC: 6.4 x10E3/uL (ref 3.4–10.8)

## 2024-04-09 DIAGNOSIS — H353221 Exudative age-related macular degeneration, left eye, with active choroidal neovascularization: Secondary | ICD-10-CM | POA: Diagnosis not present

## 2024-04-09 DIAGNOSIS — H353132 Nonexudative age-related macular degeneration, bilateral, intermediate dry stage: Secondary | ICD-10-CM | POA: Diagnosis not present

## 2024-04-09 DIAGNOSIS — H35352 Cystoid macular degeneration, left eye: Secondary | ICD-10-CM | POA: Diagnosis not present

## 2024-04-09 DIAGNOSIS — H35372 Puckering of macula, left eye: Secondary | ICD-10-CM | POA: Diagnosis not present

## 2024-04-09 DIAGNOSIS — H35371 Puckering of macula, right eye: Secondary | ICD-10-CM | POA: Diagnosis not present

## 2024-04-09 DIAGNOSIS — H35351 Cystoid macular degeneration, right eye: Secondary | ICD-10-CM | POA: Diagnosis not present

## 2024-05-16 ENCOUNTER — Other Ambulatory Visit: Payer: Self-pay

## 2024-05-16 ENCOUNTER — Emergency Department (HOSPITAL_BASED_OUTPATIENT_CLINIC_OR_DEPARTMENT_OTHER): Admitting: Radiology

## 2024-05-16 ENCOUNTER — Encounter (HOSPITAL_BASED_OUTPATIENT_CLINIC_OR_DEPARTMENT_OTHER): Payer: Self-pay

## 2024-05-16 ENCOUNTER — Emergency Department (HOSPITAL_BASED_OUTPATIENT_CLINIC_OR_DEPARTMENT_OTHER)
Admission: EM | Admit: 2024-05-16 | Discharge: 2024-05-16 | Disposition: A | Discharge status: Home / Self Care | Attending: Emergency Medicine | Admitting: Emergency Medicine

## 2024-05-16 DIAGNOSIS — I480 Paroxysmal atrial fibrillation: Secondary | ICD-10-CM | POA: Diagnosis not present

## 2024-05-16 DIAGNOSIS — R058 Other specified cough: Secondary | ICD-10-CM | POA: Diagnosis present

## 2024-05-16 DIAGNOSIS — J019 Acute sinusitis, unspecified: Secondary | ICD-10-CM | POA: Diagnosis not present

## 2024-05-16 DIAGNOSIS — Z7901 Long term (current) use of anticoagulants: Secondary | ICD-10-CM | POA: Insufficient documentation

## 2024-05-16 DIAGNOSIS — J111 Influenza due to unidentified influenza virus with other respiratory manifestations: Secondary | ICD-10-CM

## 2024-05-16 DIAGNOSIS — J101 Influenza due to other identified influenza virus with other respiratory manifestations: Secondary | ICD-10-CM | POA: Insufficient documentation

## 2024-05-16 LAB — COMPREHENSIVE METABOLIC PANEL WITH GFR
ALT: 24 U/L (ref 0–44)
AST: 26 U/L (ref 15–41)
Albumin: 4.3 g/dL (ref 3.5–5.0)
Alkaline Phosphatase: 49 U/L (ref 38–126)
Anion gap: 11 (ref 5–15)
BUN: 18 mg/dL (ref 8–23)
CO2: 24 mmol/L (ref 22–32)
Calcium: 9.6 mg/dL (ref 8.9–10.3)
Chloride: 103 mmol/L (ref 98–111)
Creatinine, Ser: 1.2 mg/dL (ref 0.61–1.24)
GFR, Estimated: >60 mL/min
Glucose, Bld: 110 mg/dL — ABNORMAL HIGH (ref 70–99)
Potassium: 4.5 mmol/L (ref 3.5–5.1)
Sodium: 138 mmol/L (ref 135–145)
Total Bilirubin: 0.5 mg/dL (ref 0.0–1.2)
Total Protein: 7.1 g/dL (ref 6.5–8.1)

## 2024-05-16 LAB — URINALYSIS, ROUTINE W REFLEX MICROSCOPIC
Bacteria, UA: NONE SEEN
Bilirubin Urine: NEGATIVE
Glucose, UA: NEGATIVE mg/dL
Hgb urine dipstick: NEGATIVE
Ketones, ur: NEGATIVE mg/dL
Leukocytes,Ua: NEGATIVE
Nitrite: NEGATIVE
Protein, ur: NEGATIVE mg/dL
Specific Gravity, Urine: 1.018 (ref 1.005–1.030)
pH: 6.5 (ref 5.0–8.0)

## 2024-05-16 LAB — CBC
HCT: 37.6 % — ABNORMAL LOW (ref 39.0–52.0)
Hemoglobin: 12.9 g/dL — ABNORMAL LOW (ref 13.0–17.0)
MCH: 30.7 pg (ref 26.0–34.0)
MCHC: 34.3 g/dL (ref 30.0–36.0)
MCV: 89.5 fL (ref 80.0–100.0)
Platelets: 232 K/uL (ref 150–400)
RBC: 4.2 MIL/uL — ABNORMAL LOW (ref 4.22–5.81)
RDW: 12.3 % (ref 11.5–15.5)
WBC: 6.1 K/uL (ref 4.0–10.5)
nRBC: 0 % (ref 0.0–0.2)

## 2024-05-16 LAB — RESP PANEL BY RT-PCR (RSV, FLU A&B, COVID)  RVPGX2
Influenza A by PCR: POSITIVE — AB
Influenza B by PCR: NEGATIVE
Resp Syncytial Virus by PCR: NEGATIVE
SARS Coronavirus 2 by RT PCR: NEGATIVE

## 2024-05-16 LAB — LIPASE, BLOOD: Lipase: 62 U/L — ABNORMAL HIGH (ref 11–51)

## 2024-05-16 MED ORDER — AMOXICILLIN-POT CLAVULANATE 875-125 MG PO TABS: 1.0000 | ORAL_TABLET | Freq: Two times a day (BID) | ORAL | 0 refills | Status: AC

## 2024-05-16 MED ORDER — GUAIFENESIN-CODEINE 100-10 MG/5ML PO SOLN: 10.0000 mL | ORAL | 0 refills | Status: AC | PRN

## 2024-05-16 NOTE — Discharge Instructions (Addendum)
 Thank you for letting us  evaluate you today.  You tested positive for flu.  Your hemoglobin levels/blood counts are within normal limits.  Your electrolytes within normal limits.  Chest x-ray did not show any pneumonia.  I have also sent Augmentin  for possible sinus infection.  This may cause some nausea, diarrhea this is normal  Most importantly, make sure they drink plenty of water, Gatorade, Pedialyte, chicken broth.  If you do not drink that is okay as long as you are staying hydrated.  You may take Tylenol  at home for body aches, temperature greater than 100.4 F  Return to Emergency Department if you experience altered mentation, intractable vomiting causing you be unable to keep fluids down, chest pain, shortness of breath, worsening symptoms

## 2024-05-16 NOTE — ED Notes (Signed)
 Reviewed discharge instructions, medications, and home care with pt. Pt verbalized understanding and had no further questions. Pt exited ED without complications.

## 2024-05-16 NOTE — ED Triage Notes (Signed)
 Pt reports GI problems for several months. Pt went to [place] from [facility] to 18DEC and c/o fever, flu like S/S.

## 2024-05-16 NOTE — ED Provider Notes (Signed)
 " [place] EMERGENCY DEPARTMENT AT [facility] Provider Note   CSN: [id number] Arrival date & time: 05/16/24  1128     Patient presents with: Fever   Robert Hart is a 73 y.o. male with past medical history of PAF (on Eliquis ), PE presents Emergency Department for evaluation of productive cough, congestion, subjective fever, chills, fatigue, sinus pressure, faitgue that started yesterday.  Had a couple episodes of diarrhea yesterday but has never had nausea, vomiting.  Is passing gas. No blood in stool, melena, BRBPR. Last BM today and normal.  Family has had similar symptoms.  Denies chest pain, shortness of breath.  He expresses that he believes that he has a sinus infection and has had required ENT to drain his sinuses in the past. Has been having sinus pressure for past week  Today, sought ED evaluation as patient expresses increased fatigue over the past week.  Prior to this episode of symptoms, he also had 5 days of similar symptoms but resolved when symptoms started back up again yesterday.  Patient has been going to work while sick and not resting.  Also endorses decreased appetite  Has been traveling to multiple countries over the past 2 months     Fever      Prior to Admission medications  Medication Sig Start Date End Date Taking? Authorizing Provider  amoxicillin -clavulanate (AUGMENTIN ) 875-125 MG tablet Take 1 tablet by mouth every 12 (twelve) hours. 05/16/24  Yes Minnie Tinnie BRAVO, PA  guaiFENesin -codeine  100-10 MG/5ML syrup Take 10 mLs by mouth every 4 (four) hours as needed for cough. 05/16/24  Yes Minnie Tinnie BRAVO, PA  acetaminophen  (TYLENOL ) 500 MG tablet Take 1,000 mg by mouth as needed. 11/02/21   [provider]  apixaban  (ELIQUIS ) 5 MG TABS tablet Take 1 tablet (5 mg total) by mouth 2 (two) times daily. 03/27/24   Terra Fairy PARAS, PA-C  B Complex Vitamins (VITAMIN B-COMPLEX) TABS Take 1 tablet by mouth daily. 10/24/21   [provider]   cholecalciferol (VITAMIN D3) 25 MCG (1000 UNIT) tablet Take 200 Units by mouth every morning. 10/24/21   [provider]  flecainide  (TAMBOCOR ) 100 MG tablet Take 1 tablet (100 mg total) by mouth 2 (two) times daily. 03/27/24   Terra Fairy PARAS, PA-C  ketorolac (ACULAR) 0.5 % ophthalmic solution Place 1 drop into the right eye 2 (two) times daily. 07/14/23   [provider]  loratadine (CLARITIN) 10 MG tablet Take 10 mg by mouth daily.    [provider]  metoprolol  succinate (TOPROL -XL) 25 MG 24 hr tablet Take 0.5 tablets (12.5 mg total) by mouth daily. 10/24/23   Terra Fairy PARAS, PA-C  Multiple Vitamin (MULTIVITAMIN) tablet Take 1 tablet by mouth daily. Solgar # 7-For Sales Promotion Account Executive, Historical, MD  Multiple Vitamins-Minerals (MULTIVITAMIN & MINERAL PO) Take 1 tablet by mouth daily.    [provider]  Multiple Vitamins-Minerals (PRESERVISION AREDS 2+MULTI VIT PO) Take 1 capsule by mouth in the morning and at bedtime.    [provider]  rosuvastatin (CRESTOR) 20 MG tablet Take 20 mg by mouth daily.    [provider]  traMADol (ULTRAM) 50 MG tablet Take 50 mg by mouth every 6 (six) hours as needed for moderate pain or severe pain.  09/21/14   [provider]  traZODone (DESYREL) 100 MG tablet Take 100 mg by mouth at bedtime.  09/21/14   [provider]    Allergies: Patient has no known  allergies.    Review of Systems  Constitutional:  Positive for fever.    Updated Vital Signs BP 131/81   Pulse (!) 49   Temp 98.3 F (36.8 C) (Oral)   Resp 18   Ht 5' 8 (1.727 m)   Wt 68 kg   SpO2 97%   BMI 22.81 kg/m   Physical Exam Vitals and nursing note reviewed.  Constitutional:      General: He is not in acute distress.    Appearance: Normal appearance.  HENT:     Head: Normocephalic and atraumatic.     Right Ear: Tympanic membrane normal.     Left Ear: Tympanic membrane normal.     Nose: Congestion present.      Mouth/Throat:     Mouth: Mucous membranes are moist.     Pharynx: No oropharyngeal exudate or posterior oropharyngeal erythema.  Eyes:     Conjunctiva/sclera: Conjunctivae normal.  Cardiovascular:     Rate and Rhythm: Normal rate.  Pulmonary:     Effort: Pulmonary effort is normal. No respiratory distress.     Breath sounds: Normal breath sounds.  Abdominal:     General: Bowel sounds are normal. There is no distension.     Palpations: Abdomen is soft.     Tenderness: There is no abdominal tenderness. There is no guarding.  Musculoskeletal:     Right lower leg: No edema.     Left lower leg: No edema.  Skin:    Capillary Refill: Capillary refill takes less than 2 seconds.     Coloration: Skin is not jaundiced or pale.  Neurological:     Mental Status: He is alert and oriented to person, place, and time. Mental status is at baseline.     (all labs ordered are listed, but only abnormal results are displayed) Labs Reviewed  RESP PANEL BY RT-PCR (RSV, FLU A&B, COVID)  RVPGX2 - Abnormal; Notable for the following components:      Result Value   Influenza A by PCR POSITIVE (*)    All other components within normal limits  LIPASE, BLOOD - Abnormal; Notable for the following components:   Lipase 62 (*)    All other components within normal limits  COMPREHENSIVE METABOLIC PANEL WITH GFR - Abnormal; Notable for the following components:   Glucose, Bld 110 (*)    All other components within normal limits  CBC - Abnormal; Notable for the following components:   RBC 4.20 (*)    Hemoglobin 12.9 (*)    HCT 37.6 (*)    All other components within normal limits  URINALYSIS, ROUTINE W REFLEX MICROSCOPIC    EKG: None  Radiology: DG Chest 2 View Result Date: 05/16/2024 EXAM: 2 VIEW(S) XRAY OF THE CHEST 05/16/2024 12:22:00 PM COMPARISON: None available. CLINICAL HISTORY: cough FINDINGS: LUNGS AND PLEURA: Biapical pleural thickening and scarring, right worse than left. No focal  pulmonary opacity. No pleural effusion. No pneumothorax. HEART AND MEDIASTINUM: Calcified tortuous aorta. BONES AND SOFT TISSUES: Scoliosis of spine. No acute osseous abnormality. IMPRESSION: 1. No acute findings. Electronically signed by: Dayne Hassell MD 05/16/2024 01:01 PM EST RP Workstation: [id number]     Medications Ordered in the ED - No data to display                                  Medical Decision Making Amount and/or Complexity of Data Reviewed Labs: ordered. Radiology: ordered.  Risk  OTC drugs. Prescription drug management.   Patient presents to the ED for concern of cough, congestion, chills, fatigue, fever, this involves an extensive number of treatment options, and is a complaint that carries with it a high risk of complications and morbidity.  The differential diagnosis includes COVID, flu, RSV, pneumonia, sinusitis, electrolyte abnormality.  Nonexhaustive list   Co morbidities that complicate the patient evaluation  See HPI   Additional history obtained:  Additional history obtained from  Nursing   External records from outside source obtained and reviewed including triage note   Lab Tests:  I Ordered, and personally interpreted labs.  The pertinent results include:   Flu a positive UA without infection Hgb 12.9   Imaging Studies ordered:  I ordered imaging studies including chest x-ray I independently visualized and interpreted imaging which showed no cardiopulmonary pathology I agree with the radiologist interpretation    Medicines ordered and prescription drug management:  I ordered medication including augmentin   for bacterial sinusitis I have reviewed the patients home medicines and have made adjustments as needed     Problem List / ED Course:  Influenza Vital signs hemodynamically stable with no fever nor tachycardia Recently traveled to multiple countries over past month but has not had prolonged diarrhea, blood in stool, BRBPR.  No abdominal pain No signs of sepsis with no leukocytosis nor anion gap Chest x-ray without pneumonia.  No signs of respiratory distress.  Maintaining oxygen saturation without supplementation. No complaints of cp. Had extensive conversation regarding Tamiflu as patient is not within window.  Discussed risks and benefits.  They do not wish to proceed with Tamiflu at this time. Provided codeine  for cough as this has helped in past Discussed other symptomatic treatment at home to include importance of oral hydration, Tylenol , ibuprofen for body aches, pain, fever Sinusitis Endorses sinus pressure for greater than 7 days.   Has history of having drainage of sinuses in past by ENT Will provide Augmentin  for bacterial sinusitis coverage   Reevaluation:  After the interventions noted above, I reevaluated the patient and found that they have :improved    Dispostion:  After consideration of the diagnostic results and the patients response to treatment, I feel that the patent would benefit from outpatient management symptomatic treatment.   Discussed ED workup, disposition, return to ED precautions with patient who expresses understanding agrees with plan.  All questions answered to their satisfaction.  They are agreeable to plan.  Discharge instructions provided on paperwork  Final diagnoses:  Influenza  Acute non-recurrent sinusitis, unspecified location    ED Discharge Orders          Ordered    guaiFENesin -codeine  100-10 MG/5ML syrup  Every 4 hours PRN        05/16/24 1738    amoxicillin -clavulanate (AUGMENTIN ) 875-125 MG tablet  Every 12 hours        05/16/24 1739             Minnie Tinnie BRAVO, PA 05/17/24 1848  "

## 2024-06-11 ENCOUNTER — Other Ambulatory Visit: Payer: Self-pay | Admitting: Urology

## 2024-06-11 DIAGNOSIS — R972 Elevated prostate specific antigen [PSA]: Secondary | ICD-10-CM

## 2024-07-29 ENCOUNTER — Other Ambulatory Visit

## 2024-09-24 ENCOUNTER — Ambulatory Visit (HOSPITAL_COMMUNITY): Admitting: Internal Medicine
